# Patient Record
Sex: Male | Born: 1955
Health system: Southern US, Community
[De-identification: ages and names within clinical notes are randomized; demographics above are authoritative.]

## PROBLEM LIST (undated history)

## (undated) DIAGNOSIS — I499 Cardiac arrhythmia, unspecified: Secondary | ICD-10-CM

## (undated) DIAGNOSIS — I10 Essential (primary) hypertension: Secondary | ICD-10-CM

## (undated) DIAGNOSIS — T7840XA Allergy, unspecified, initial encounter: Secondary | ICD-10-CM

## (undated) DIAGNOSIS — R200 Anesthesia of skin: Secondary | ICD-10-CM

## (undated) DIAGNOSIS — M1990 Unspecified osteoarthritis, unspecified site: Secondary | ICD-10-CM

## (undated) HISTORY — DX: Allergy, unspecified, initial encounter: T78.40XA

## (undated) HISTORY — PX: TONSILLECTOMY: SUR1361

---

## 2002-02-28 ENCOUNTER — Emergency Department (HOSPITAL_COMMUNITY): Admission: EM | Admit: 2002-02-28 | Discharge: 2002-02-28 | Payer: Self-pay | Admitting: Emergency Medicine

## 2004-04-08 ENCOUNTER — Emergency Department (HOSPITAL_COMMUNITY): Admission: EM | Admit: 2004-04-08 | Discharge: 2004-04-08 | Payer: Self-pay | Admitting: Family Medicine

## 2005-03-10 ENCOUNTER — Emergency Department (HOSPITAL_COMMUNITY): Admission: EM | Admit: 2005-03-10 | Discharge: 2005-03-10 | Payer: Self-pay | Admitting: Family Medicine

## 2006-05-14 ENCOUNTER — Emergency Department (HOSPITAL_COMMUNITY): Admission: EM | Admit: 2006-05-14 | Discharge: 2006-05-14 | Payer: Self-pay | Admitting: Family Medicine

## 2006-08-05 ENCOUNTER — Emergency Department (HOSPITAL_COMMUNITY): Admission: EM | Admit: 2006-08-05 | Discharge: 2006-08-05 | Payer: Self-pay | Admitting: Family Medicine

## 2007-02-01 ENCOUNTER — Emergency Department (HOSPITAL_COMMUNITY): Admission: EM | Admit: 2007-02-01 | Discharge: 2007-02-01 | Payer: Self-pay | Admitting: Emergency Medicine

## 2007-03-16 ENCOUNTER — Emergency Department (HOSPITAL_COMMUNITY): Admission: EM | Admit: 2007-03-16 | Discharge: 2007-03-16 | Payer: Self-pay | Admitting: Emergency Medicine

## 2007-05-08 ENCOUNTER — Emergency Department (HOSPITAL_COMMUNITY): Admission: EM | Admit: 2007-05-08 | Discharge: 2007-05-08 | Payer: Self-pay | Admitting: Emergency Medicine

## 2007-08-24 ENCOUNTER — Encounter (INDEPENDENT_AMBULATORY_CARE_PROVIDER_SITE_OTHER): Payer: Self-pay | Admitting: *Deleted

## 2008-01-07 ENCOUNTER — Encounter: Admission: RE | Admit: 2008-01-07 | Discharge: 2008-01-07 | Payer: Self-pay | Admitting: Sports Medicine

## 2009-01-06 ENCOUNTER — Emergency Department (HOSPITAL_COMMUNITY): Admission: EM | Admit: 2009-01-06 | Discharge: 2009-01-06 | Payer: Self-pay | Admitting: Family Medicine

## 2009-05-11 ENCOUNTER — Encounter: Payer: Self-pay | Admitting: *Deleted

## 2009-06-19 ENCOUNTER — Encounter: Payer: Self-pay | Admitting: *Deleted

## 2009-08-13 ENCOUNTER — Emergency Department (HOSPITAL_COMMUNITY): Admission: EM | Admit: 2009-08-13 | Discharge: 2009-08-13 | Payer: Self-pay | Admitting: Emergency Medicine

## 2009-11-29 ENCOUNTER — Ambulatory Visit: Payer: Self-pay | Admitting: Internal Medicine

## 2010-01-29 ENCOUNTER — Encounter (INDEPENDENT_AMBULATORY_CARE_PROVIDER_SITE_OTHER): Payer: Self-pay | Admitting: Internal Medicine

## 2010-01-29 ENCOUNTER — Ambulatory Visit: Payer: Self-pay | Admitting: Internal Medicine

## 2010-01-29 LAB — CONVERTED CEMR LAB
BUN: 19 mg/dL (ref 6–23)
Calcium: 9.9 mg/dL (ref 8.4–10.5)
Chloride: 99 meq/L (ref 96–112)
Creatinine, Ser: 1 mg/dL (ref 0.40–1.50)
Glucose, Bld: 46 mg/dL — ABNORMAL LOW (ref 70–99)
Microalb, Ur: 0.6 mg/dL (ref 0.00–1.89)
Potassium: 4 meq/L (ref 3.5–5.3)

## 2010-05-07 NOTE — Miscellaneous (Signed)
Summary: Do Not Reschedule  Missed NP appt.  Per [facility] policy is not allowed to reschedule.  Dennison Nancy RN  May 11, 2009 10:05 AM

## 2010-05-07 NOTE — Miscellaneous (Signed)
Summary: See Bartholome Bill if patient calls  This patient was given a second chance after no showing his NP appt and no showed that one.  He IS NOT allowed to reschedule. Dennison Nancy RN  June 19, 2009 4:22 PM

## 2010-07-11 LAB — CULTURE, ROUTINE-ABSCESS

## 2010-07-13 ENCOUNTER — Inpatient Hospital Stay (INDEPENDENT_AMBULATORY_CARE_PROVIDER_SITE_OTHER)
Admission: RE | Admit: 2010-07-13 | Discharge: 2010-07-13 | Disposition: A | Discharge status: Home / Self Care | Payer: Self-pay | Source: Ambulatory Visit | Attending: Family Medicine | Admitting: Family Medicine

## 2010-07-13 DIAGNOSIS — S8010XA Contusion of unspecified lower leg, initial encounter: Secondary | ICD-10-CM

## 2012-07-02 ENCOUNTER — Encounter: Payer: Self-pay | Admitting: Family Medicine

## 2013-06-14 ENCOUNTER — Other Ambulatory Visit (HOSPITAL_COMMUNITY): Payer: Self-pay | Admitting: Orthopaedic Surgery

## 2013-08-22 NOTE — Patient Instructions (Signed)
Your procedure is scheduled on:08/26/13  FRIDAY    Report to [facility] at   1000    AM.  Call this number if you have problems the morning of surgery: [phone]        Do not eat food :After Midnight. Thursday NIGHT--- MAY HAVE CLEAR LIQUIDS Friday MORNING UNTIL 06:00 AM,  THEN NOTHING BY MOUTH   Take these medicines the morning of surgery with A SIP OF WATER:   .  Contacts, dentures or partial plates, or metal hairpins  can not be worn to surgery. Your family will be responsible for glasses, dentures, hearing aides while you are in surgery  Leave suitcase in the car. After surgery it may be brought to your room.  For patients admitted to the hospital, checkout time is 11:00 AM day of  discharge.                DO NOT WEAR JEWELRY, LOTIONS, POWDERS, OR PERFUMES.  WOMEN-- DO NOT SHAVE LEGS OR UNDERARMS FOR 48 HOURS BEFORE SHOWERS. MEN MAY SHAVE FACE.  Patients discharged the day of surgery will not be allowed to drive home. IF going home the day of surgery, you must have a driver and someone to stay with you for the first 24 hours  Name and phone number of your driver:                                                                                                                                         [facility] - Preparing for Surgery Before surgery, you can play an important role.  Because skin is not sterile, your skin needs to be as free of germs as possible.  You can reduce the number of germs on your skin by washing with CHG (chlorahexidine gluconate) soap before surgery.  CHG is an antiseptic cleaner which kills germs and bonds with the skin to continue killing germs even after washing. Please DO NOT use if you have an allergy to CHG or antibacterial soaps.  If your skin becomes reddened/irritated stop using the CHG and inform your nurse when you arrive at Short Stay. Do not shave (including legs and underarms) for at least 48 hours prior to the first CHG  shower.  You may shave your face. Please follow these instructions carefully:  1.  Shower with CHG Soap the night before surgery and the  morning of Surgery.  2.  If you choose to wash your hair, wash your hair first as usual with your  normal  shampoo.  3.  After you shampoo, rinse your hair and body thoroughly to remove the  shampoo.                           4.  Use CHG as you would any other liquid soap.  You can apply chg directly  to the skin and wash                       Gently with a scrungie or clean washcloth.  5.  Apply the CHG Soap to your body ONLY FROM THE NECK DOWN.   Do not use on open                           Wound or open sores. Avoid contact with eyes, ears mouth and genitals (private parts).                        Genitals (private parts) with your normal soap.             6.  Wash thoroughly, paying special attention to the area where your surgery  will be performed.  7.  Thoroughly rinse your body with warm water from the neck down.  8.  DO NOT shower/wash with your normal soap after using and rinsing off  the CHG Soap.                9.  Pat yourself dry with a clean towel.            10.  Wear clean pajamas.            11.  Place clean sheets on your bed the night of your first shower and do not  sleep with pets. Day of Surgery : Do not apply any lotions/deodorants the morning of surgery.  Please wear clean clothes to the hospital/surgery center.  FAILURE TO FOLLOW THESE INSTRUCTIONS MAY RESULT IN THE CANCELLATION OF YOUR SURGERY PATIENT SIGNATURE_________________________________  NURSE SIGNATURE__________________________________  ________________________________________________________________________    CLEAR LIQUID DIET   Foods Allowed                                                                     Foods Excluded  Coffee and tea, regular and decaf                             liquids that you cannot  Plain Jell-O in any flavor                                              see through such as: Fruit ices (not with fruit pulp)                                     milk, soups, orange juice  Iced Popsicles                                    All solid food Carbonated beverages, regular and diet  Cranberry, grape and apple juices Sports drinks like Gatorade Lightly seasoned clear broth or consume(fat free) Sugar, honey syrup  Sample Menu Breakfast                                Lunch                                     Supper Cranberry juice                    Beef broth                            Chicken broth Jell-O                                     Grape juice                           Apple juice Coffee or tea                        Jell-O                                      Popsicle                                                Coffee or tea                        Coffee or tea  _____________________________________________________________________   WHAT IS A BLOOD TRANSFUSION? Blood Transfusion Information  A transfusion is the replacement of blood or some of its parts. Blood is made up of multiple cells which provide different functions.  Red blood cells carry oxygen and are used for blood loss replacement.  White blood cells fight against infection.  Platelets control bleeding.  Plasma helps clot blood.  Other blood products are available for specialized needs, such as hemophilia or other clotting disorders. BEFORE THE TRANSFUSION  Who gives blood for transfusions?   Healthy volunteers who are fully evaluated to make sure their blood is safe. This is blood bank blood. Transfusion therapy is the safest it has ever been in the practice of medicine. Before blood is taken from a donor, a complete history is taken to make sure that person has no history of diseases nor engages in risky social behavior (examples are intravenous drug use or sexual activity with multiple partners). The donor's travel  history is screened to minimize risk of transmitting infections, such as malaria. The donated blood is tested for signs of infectious diseases, such as HIV and hepatitis. The blood is then tested to be sure it is compatible with you in order to minimize the chance of a transfusion reaction. If you or a relative donates blood, this is often done in anticipation of surgery and is not appropriate for emergency situations. It takes many days to process the donated blood.  RISKS AND COMPLICATIONS Although transfusion therapy is very safe and saves many lives, the main dangers of transfusion include:   Getting an infectious disease.  Developing a transfusion reaction. This is an allergic reaction to something in the blood you were given. Every precaution is taken to prevent this. The decision to have a blood transfusion has been considered carefully by your caregiver before blood is given. Blood is not given unless the benefits outweigh the risks. AFTER THE TRANSFUSION  Right after receiving a blood transfusion, you will usually feel much better and more energetic. This is especially true if your red blood cells have gotten low (anemic). The transfusion raises the level of the red blood cells which carry oxygen, and this usually causes an energy increase.  The nurse administering the transfusion will monitor you carefully for complications. HOME CARE INSTRUCTIONS  No special instructions are needed after a transfusion. You may find your energy is better. Speak with your caregiver about any limitations on activity for underlying diseases you may have. SEEK MEDICAL CARE IF:   Your condition is not improving after your transfusion.  You develop redness or irritation at the intravenous (IV) site. SEEK IMMEDIATE MEDICAL CARE IF:  Any of the following symptoms occur over the next 12 hours:  Shaking chills.  You have a temperature by mouth above 102 F (38.9 C), not controlled by medicine.  Chest,  back, or muscle pain.  People around you feel you are not acting correctly or are confused.  Shortness of breath or difficulty breathing.  Dizziness and fainting.  You get a rash or develop hives.  You have a decrease in urine output.  Your urine turns a dark color or changes to pink, red, or brown. Any of the following symptoms occur over the next 10 days:  You have a temperature by mouth above 102 F (38.9 C), not controlled by medicine.  Shortness of breath.  Weakness after normal activity.  The white part of the eye turns yellow (jaundice).  You have a decrease in the amount of urine or are urinating less often.  Your urine turns a dark color or changes to pink, red, or brown. Document Released: 03/21/2000 Document Revised: 06/16/2011 Document Reviewed: 11/08/2007 [facility] Patient Information 2014 [place].  _______________________________________________________________________

## 2013-08-23 ENCOUNTER — Inpatient Hospital Stay (HOSPITAL_COMMUNITY)
Admission: RE | Admit: 2013-08-23 | Discharge: 2013-08-23 | Disposition: A | Discharge status: Home / Self Care | Payer: Self-pay | Source: Ambulatory Visit

## 2013-08-24 ENCOUNTER — Encounter (HOSPITAL_COMMUNITY)
Admission: RE | Admit: 2013-08-24 | Discharge: 2013-08-24 | Disposition: A | Discharge status: Home / Self Care | Payer: Medicare HMO | Source: Ambulatory Visit | Attending: Orthopaedic Surgery | Admitting: Orthopaedic Surgery

## 2013-08-24 ENCOUNTER — Encounter (HOSPITAL_COMMUNITY): Payer: Self-pay

## 2013-08-24 ENCOUNTER — Ambulatory Visit (HOSPITAL_COMMUNITY)
Admission: RE | Admit: 2013-08-24 | Discharge: 2013-08-24 | Disposition: A | Discharge status: Home / Self Care | Payer: Medicare HMO | Source: Ambulatory Visit | Attending: Anesthesiology | Admitting: Anesthesiology

## 2013-08-24 ENCOUNTER — Encounter (HOSPITAL_COMMUNITY): Payer: Self-pay | Admitting: Pharmacy Technician

## 2013-08-24 DIAGNOSIS — Z01818 Encounter for other preprocedural examination: Secondary | ICD-10-CM | POA: Insufficient documentation

## 2013-08-24 DIAGNOSIS — Z01812 Encounter for preprocedural laboratory examination: Secondary | ICD-10-CM | POA: Insufficient documentation

## 2013-08-24 DIAGNOSIS — Z01810 Encounter for preprocedural cardiovascular examination: Secondary | ICD-10-CM | POA: Insufficient documentation

## 2013-08-24 HISTORY — DX: Unspecified osteoarthritis, unspecified site: M19.90

## 2013-08-24 HISTORY — DX: Essential (primary) hypertension: I10

## 2013-08-24 LAB — URINALYSIS, ROUTINE W REFLEX MICROSCOPIC
BILIRUBIN URINE: NEGATIVE
Glucose, UA: NEGATIVE mg/dL
Hgb urine dipstick: NEGATIVE
Ketones, ur: NEGATIVE mg/dL
Leukocytes, UA: NEGATIVE
Nitrite: NEGATIVE
PH: 6 (ref 5.0–8.0)
PROTEIN: NEGATIVE mg/dL
Specific Gravity, Urine: 1.021 (ref 1.005–1.030)
Urobilinogen, UA: 0.2 mg/dL (ref 0.0–1.0)

## 2013-08-24 LAB — BASIC METABOLIC PANEL
BUN: 18 mg/dL (ref 6–23)
CO2: 30 mEq/L (ref 19–32)
CREATININE: 1.05 mg/dL (ref 0.50–1.35)
Calcium: 9.8 mg/dL (ref 8.4–10.5)
Chloride: 100 mEq/L (ref 96–112)
GFR calc Af Amer: 89 mL/min — ABNORMAL LOW (ref >90)
GFR, EST NON AFRICAN AMERICAN: 77 mL/min — AB (ref >90)
GLUCOSE: 94 mg/dL (ref 70–99)
Potassium: 5.2 mEq/L (ref 3.7–5.3)
SODIUM: 139 meq/L (ref 137–147)

## 2013-08-24 LAB — CBC
HCT: 42.3 % (ref 39.0–52.0)
Hemoglobin: 14.7 g/dL (ref 13.0–17.0)
MCH: 30.9 pg (ref 26.0–34.0)
MCHC: 34.8 g/dL (ref 30.0–36.0)
MCV: 89.1 fL (ref 78.0–100.0)
PLATELETS: 220 10*3/uL (ref 150–400)
RBC: 4.75 MIL/uL (ref 4.22–5.81)
RDW: 12.9 % (ref 11.5–15.5)
WBC: 9.1 10*3/uL (ref 4.0–10.5)

## 2013-08-24 LAB — SURGICAL PCR SCREEN
MRSA, PCR: NEGATIVE
STAPHYLOCOCCUS AUREUS: NEGATIVE

## 2013-08-24 LAB — APTT: APTT: 33 s (ref 24–37)

## 2013-08-24 LAB — PROTIME-INR
INR: 1.05 (ref 0.00–1.49)
Prothrombin Time: 13.5 seconds (ref 11.6–15.2)

## 2013-08-24 NOTE — Patient Instructions (Addendum)
20 Victor Santiago  08/24/2013   Your procedure is scheduled on: 08/26/13  [facility].  Call this number if you have problems the morning of surgery [phone]   Remember:   Do not eat food or drink liquids After Midnight.   Do not wear jewelry, make-up or nail polish.  Do not wear lotions, powders, or perfumes. You may wear deodorant.  Do not shave 48 hours prior to surgery. Men may shave face and neck.  Do not bring valuables to the hospital.  Contacts, dentures or bridgework may not be worn into surgery.  Leave suitcase in the car. After surgery it may be brought to your room.  For patients admitted to the hospital, checkout time is 11:00 AM the day of discharge.   Please read over the following fact sheets that you were given: MRSA Information Birdie Sonsachel Rulon Abdalla, RN  pre op nurse call if needed [phone]    [facility] - Preparing for Surgery Before surgery, you can play an important role.  Because skin is not sterile, your skin needs to be as free of germs as possible.  You can reduce the number of germs on your skin by washing with CHG (chlorahexidine gluconate) soap before surgery.  CHG is an antiseptic cleaner which kills germs and bonds with the skin to continue killing germs even after washing. Please DO NOT use if you have an allergy to CHG or antibacterial soaps.  If your skin becomes reddened/irritated stop using the CHG and inform your nurse when you arrive at Short Stay. Do not shave (including legs and underarms) for at least 48 hours prior to the first CHG shower.  You may shave your face/neck. Please follow these instructions carefully:  1.  Shower with CHG Soap the night before surgery and the  morning of Surgery.  2.  If you choose to wash your hair, wash your hair first as usual with your  normal  shampoo.  3.  After you shampoo, rinse your hair and body thoroughly to remove the  shampoo.                            4.  Use  CHG as you would any other liquid soap.  You can apply chg directly  to the skin and wash                       Gently with a scrungie or clean washcloth.  5.  Apply the CHG Soap to your body ONLY FROM THE NECK DOWN.   Do not use on face/ open                           Wound or open sores. Avoid contact with eyes, ears mouth and genitals (private parts).                       Wash face,  Genitals (private parts) with your normal soap.             6.  Wash thoroughly, paying special attention to the area where your surgery  will be performed.  7.  Thoroughly rinse your body with warm water from the neck down.  8.  DO NOT shower/wash with your normal soap after using and rinsing off  the CHG Soap.  9.  Pat yourself dry with a clean towel.            10.  Wear clean pajamas.            11.  Place clean sheets on your bed the night of your first shower and do not  sleep with pets. Day of Surgery : Do not apply any lotions the morning of surgery.  Please wear clean clothes to the hospital/surgery center.  FAILURE TO FOLLOW THESE INSTRUCTIONS MAY RESULT IN THE CANCELLATION OF YOUR SURGERY PATIENT SIGNATURE_________________________________  NURSE SIGNATURE__________________________________  ________________________________________________________________________  WHAT IS A BLOOD TRANSFUSION? Blood Transfusion Information  A transfusion is the replacement of blood or some of its parts. Blood is made up of multiple cells which provide different functions.  Red blood cells carry oxygen and are used for blood loss replacement.  White blood cells fight against infection.  Platelets control bleeding.  Plasma helps clot blood.  Other blood products are available for specialized needs, such as hemophilia or other clotting disorders. BEFORE THE TRANSFUSION  Who gives blood for transfusions?   Healthy volunteers who are fully evaluated to make sure their blood is safe. This is blood  bank blood. Transfusion therapy is the safest it has ever been in the practice of medicine. Before blood is taken from a donor, a complete history is taken to make sure that person has no history of diseases nor engages in risky social behavior (examples are intravenous drug use or sexual activity with multiple partners). The donor's travel history is screened to minimize risk of transmitting infections, such as malaria. The donated blood is tested for signs of infectious diseases, such as HIV and hepatitis. The blood is then tested to be sure it is compatible with you in order to minimize the chance of a transfusion reaction. If you or a relative donates blood, this is often done in anticipation of surgery and is not appropriate for emergency situations. It takes many days to process the donated blood. RISKS AND COMPLICATIONS Although transfusion therapy is very safe and saves many lives, the main dangers of transfusion include:   Getting an infectious disease.  Developing a transfusion reaction. This is an allergic reaction to something in the blood you were given. Every precaution is taken to prevent this. The decision to have a blood transfusion has been considered carefully by your caregiver before blood is given. Blood is not given unless the benefits outweigh the risks. AFTER THE TRANSFUSION  Right after receiving a blood transfusion, you will usually feel much better and more energetic. This is especially true if your red blood cells have gotten low (anemic). The transfusion raises the level of the red blood cells which carry oxygen, and this usually causes an energy increase.  The nurse administering the transfusion will monitor you carefully for complications. HOME CARE INSTRUCTIONS  No special instructions are needed after a transfusion. You may find your energy is better. Speak with your caregiver about any limitations on activity for underlying diseases you may have. SEEK MEDICAL CARE  IF:   Your condition is not improving after your transfusion.  You develop redness or irritation at the intravenous (IV) site. SEEK IMMEDIATE MEDICAL CARE IF:  Any of the following symptoms occur over the next 12 hours:  Shaking chills.  You have a temperature by mouth above 102 F (38.9 C), not controlled by medicine.  Chest, back, or muscle pain.  People around you feel you are not acting correctly or  are confused.  Shortness of breath or difficulty breathing.  Dizziness and fainting.  You get a rash or develop hives.  You have a decrease in urine output.  Your urine turns a dark color or changes to pink, red, or brown. Any of the following symptoms occur over the next 10 days:  You have a temperature by mouth above 102 F (38.9 C), not controlled by medicine.  Shortness of breath.  Weakness after normal activity.  The white part of the eye turns yellow (jaundice).  You have a decrease in the amount of urine or are urinating less often.  Your urine turns a dark color or changes to pink, red, or brown. Document Released: 03/21/2000 Document Revised: 06/16/2011 Document Reviewed: 11/08/2007 [employer] Patient Information 2014 [place].  _______________________________________________________________________  Incentive Spirometer  An incentive spirometer is a tool that can help keep your lungs clear and active. This tool measures how well you are filling your lungs with each breath. Taking long deep breaths may help reverse or decrease the chance of developing breathing (pulmonary) problems (especially infection) following:  A long period of time when you are unable to move or be active. BEFORE THE PROCEDURE   If the spirometer includes an indicator to show your best effort, your nurse or respiratory therapist will set it to a desired goal.  If possible, sit up straight or lean slightly forward. Try not to slouch.  Hold the incentive spirometer in an  upright position. INSTRUCTIONS FOR USE  1. Sit on the edge of your bed if possible, or sit up as far as you can in bed or on a chair. 2. Hold the incentive spirometer in an upright position. 3. Breathe out normally. 4. Place the mouthpiece in your mouth and seal your lips tightly around it. 5. Breathe in slowly and as deeply as possible, raising the piston or the ball toward the top of the column. 6. Hold your breath for 3-5 seconds or for as long as possible. Allow the piston or ball to fall to the bottom of the column. 7. Remove the mouthpiece from your mouth and breathe out normally. 8. Rest for a few seconds and repeat Steps 1 through 7 at least 10 times every 1-2 hours when you are awake. Take your time and take a few normal breaths between deep breaths. 9. The spirometer may include an indicator to show your best effort. Use the indicator as a goal to work toward during each repetition. 10. After each set of 10 deep breaths, practice coughing to be sure your lungs are clear. If you have an incision (the cut made at the time of surgery), support your incision when coughing by placing a pillow or rolled up towels firmly against it. Once you are able to get out of bed, walk around indoors and cough well. You may stop using the incentive spirometer when instructed by your caregiver.  RISKS AND COMPLICATIONS  Take your time so you do not get dizzy or light-headed.  If you are in pain, you may need to take or ask for pain medication before doing incentive spirometry. It is harder to take a deep breath if you are having pain. AFTER USE  Rest and breathe slowly and easily.  It can be helpful to keep track of a log of your progress. Your caregiver can provide you with a simple table to help with this. If you are using the spirometer at home, follow these instructions: [place] IF:  You are having difficultly using the spirometer.  You have trouble using the spirometer as often as  instructed.  Your pain medication is not giving enough relief while using the spirometer.  You develop fever of 100.5 F (38.1 C) or higher. SEEK IMMEDIATE MEDICAL CARE IF:   You cough up bloody sputum that had not been present before.  You develop fever of 102 F (38.9 C) or greater.  You develop worsening pain at or near the incision site. MAKE SURE YOU:   Understand these instructions.  Will watch your condition.  Will get help right away if you are not doing well or get worse. Document Released: 08/04/2006 Document Revised: 06/16/2011 Document Reviewed: 10/05/2006 [facility] Patient Information 2014 [place], [place].   ________________________________________________________________________

## 2013-08-26 ENCOUNTER — Encounter (HOSPITAL_COMMUNITY): Admission: RE | Payer: Self-pay | Source: Ambulatory Visit

## 2013-08-26 SURGERY — ARTHROPLASTY, HIP, TOTAL, ANTERIOR APPROACH
Anesthesia: Choice | Site: Hip | Laterality: Left

## 2013-08-31 ENCOUNTER — Inpatient Hospital Stay (HOSPITAL_COMMUNITY): Admission: RE | Admit: 2013-08-31 | Payer: Medicare HMO | Source: Ambulatory Visit | Admitting: Orthopaedic Surgery

## 2013-10-26 ENCOUNTER — Other Ambulatory Visit (HOSPITAL_COMMUNITY): Payer: Self-pay | Admitting: Orthopaedic Surgery

## 2013-11-08 ENCOUNTER — Encounter (HOSPITAL_COMMUNITY): Payer: Self-pay | Admitting: Pharmacy Technician

## 2013-11-08 ENCOUNTER — Other Ambulatory Visit (HOSPITAL_COMMUNITY): Payer: Self-pay | Admitting: Orthopaedic Surgery

## 2013-11-08 NOTE — Patient Instructions (Addendum)
Your procedure is scheduled on:  11/18/13  FRIDAY  Report to [facility]-- MAIN ENTRANCE- FOLLOW SIGNS TO SHORT STAY CENTER Short Stay Center at    10:20   AM.   Call this number if you have problems the morning of surgery: [phone]        Do not eat food  Or drink :After Midnight. Thursday NIGHT   Take these medicines the morning of surgery with A SIP OF WATER:NO REGULAR MEDICATIONS--- MAY TAKE TRAMADOL IF NEEDED   .  Contacts, dentures or partial plates, or metal hairpins  can not be worn to surgery. Your family will be responsible for glasses, dentures, hearing aides while you are in surgery  Leave suitcase in the car. After surgery it may be brought to your room.  For patients admitted to the hospital, checkout time is 11:00 AM day of  discharge.         [facility] IS NOT RESPONSIBLE FOR ANY VALUABLES                                                                  [place] - Preparing for Surgery Before surgery, you can play an important role.  Because skin is not sterile, your skin needs to be as free of germs as possible.  You can reduce the number of germs on your skin by washing with CHG (chlorahexidine gluconate) soap before surgery.  CHG is an antiseptic cleaner which kills germs and bonds with the skin to continue killing germs even after washing. Please DO NOT use if you have an allergy to CHG or antibacterial soaps.  If your skin becomes reddened/irritated stop using the CHG and inform your nurse when you arrive at Short Stay. Do not shave (including legs and underarms) for at least 48 hours prior to the first CHG shower.  You may shave your face/neck. Please follow these instructions carefully:  1.  Shower with CHG Soap the night before surgery and the  morning of Surgery.  2.  If you choose to wash your hair, wash your hair first as usual with your  normal  shampoo.  3.  After you shampoo, rinse your hair and body thoroughly to remove the  shampoo.                            4.  Use CHG as you would any other liquid soap.  You can apply chg directly  to the skin and wash                       Gently with a scrungie or clean washcloth.  5.  Apply the CHG Soap to your body ONLY FROM THE NECK DOWN.   Do not use on face/ open                           Wound or open sores. Avoid contact with eyes, ears mouth and genitals (private parts).                       Wash face,  Genitals (private parts) with your  normal soap.             6.  Wash thoroughly, paying special attention to the area where your surgery  will be performed.  7.  Thoroughly rinse your body with warm water from the neck down.  8.  DO NOT shower/wash with your normal soap after using and rinsing off  the CHG Soap.                9.  Pat yourself dry with a clean towel.            10.  Wear clean pajamas.            11.  Place clean sheets on your bed the night of your first shower and do not  sleep with pets. Day of Surgery : Do not apply any lotions/deodorants the morning of surgery.  Please wear clean clothes to the hospital/surgery center.  FAILURE TO FOLLOW THESE INSTRUCTIONS MAY RESULT IN THE CANCELLATION OF YOUR SURGERY PATIENT SIGNATURE_________________________________  NURSE SIGNATURE__________________________________  ________________________________________________________________________  WHAT IS A BLOOD TRANSFUSION? Blood Transfusion Information  A transfusion is the replacement of blood or some of its parts. Blood is made up of multiple cells which provide different functions.  Red blood cells carry oxygen and are used for blood loss replacement.  White blood cells fight against infection.  Platelets control bleeding.  Plasma helps clot blood.  Other blood products are available for specialized needs, such as hemophilia or other clotting disorders. BEFORE THE TRANSFUSION  Who gives blood for transfusions?   Healthy volunteers who are fully evaluated to make  sure their blood is safe. This is blood bank blood. Transfusion therapy is the safest it has ever been in the practice of medicine. Before blood is taken from a donor, a complete history is taken to make sure that person has no history of diseases nor engages in risky social behavior (examples are intravenous drug use or sexual activity with multiple partners). The donor's travel history is screened to minimize risk of transmitting infections, such as malaria. The donated blood is tested for signs of infectious diseases, such as HIV and hepatitis. The blood is then tested to be sure it is compatible with you in order to minimize the chance of a transfusion reaction. If you or a relative donates blood, this is often done in anticipation of surgery and is not appropriate for emergency situations. It takes many days to process the donated blood. RISKS AND COMPLICATIONS Although transfusion therapy is very safe and saves many lives, the main dangers of transfusion include:   Getting an infectious disease.  Developing a transfusion reaction. This is an allergic reaction to something in the blood you were given. Every precaution is taken to prevent this. The decision to have a blood transfusion has been considered carefully by your caregiver before blood is given. Blood is not given unless the benefits outweigh the risks. AFTER THE TRANSFUSION  Right after receiving a blood transfusion, you will usually feel much better and more energetic. This is especially true if your red blood cells have gotten low (anemic). The transfusion raises the level of the red blood cells which carry oxygen, and this usually causes an energy increase.  The nurse administering the transfusion will monitor you carefully for complications. HOME CARE INSTRUCTIONS  No special instructions are needed after a transfusion. You may find your energy is better. Speak with your caregiver about any limitations on activity for underlying  diseases you may  have. SEEK MEDICAL CARE IF:   Your condition is not improving after your transfusion.  You develop redness or irritation at the intravenous (IV) site. SEEK IMMEDIATE MEDICAL CARE IF:  Any of the following symptoms occur over the next 12 hours:  Shaking chills.  You have a temperature by mouth above 102 F (38.9 C), not controlled by medicine.  Chest, back, or muscle pain.  People around you feel you are not acting correctly or are confused.  Shortness of breath or difficulty breathing.  Dizziness and fainting.  You get a rash or develop hives.  You have a decrease in urine output.  Your urine turns a dark color or changes to pink, red, or brown. Any of the following symptoms occur over the next 10 days:  You have a temperature by mouth above 102 F (38.9 C), not controlled by medicine.  Shortness of breath.  Weakness after normal activity.  The white part of the eye turns yellow (jaundice).  You have a decrease in the amount of urine or are urinating less often.  Your urine turns a dark color or changes to pink, red, or brown. Document Released: 03/21/2000 Document Revised: 06/16/2011 Document Reviewed: 11/08/2007 [facility] Patient Information 2014 [place], [place].  _______________________________________________________________________

## 2013-11-08 NOTE — Progress Notes (Signed)
Chest, ekg 5/15 epic

## 2013-11-09 ENCOUNTER — Encounter (INDEPENDENT_AMBULATORY_CARE_PROVIDER_SITE_OTHER): Payer: Self-pay

## 2013-11-09 ENCOUNTER — Encounter (HOSPITAL_COMMUNITY): Payer: Self-pay

## 2013-11-09 ENCOUNTER — Encounter (HOSPITAL_COMMUNITY)
Admission: RE | Admit: 2013-11-09 | Discharge: 2013-11-09 | Disposition: A | Discharge status: Home / Self Care | Payer: Medicare HMO | Source: Ambulatory Visit | Attending: Orthopaedic Surgery | Admitting: Orthopaedic Surgery

## 2013-11-09 DIAGNOSIS — Z01818 Encounter for other preprocedural examination: Secondary | ICD-10-CM | POA: Insufficient documentation

## 2013-11-09 DIAGNOSIS — Z01812 Encounter for preprocedural laboratory examination: Secondary | ICD-10-CM | POA: Diagnosis not present

## 2013-11-09 LAB — BASIC METABOLIC PANEL
ANION GAP: 8 (ref 5–15)
BUN: 14 mg/dL (ref 6–23)
CHLORIDE: 102 meq/L (ref 96–112)
CO2: 29 meq/L (ref 19–32)
Calcium: 9.6 mg/dL (ref 8.4–10.5)
Creatinine, Ser: 1.05 mg/dL (ref 0.50–1.35)
GFR calc Af Amer: 89 mL/min — ABNORMAL LOW (ref >90)
GFR calc non Af Amer: 77 mL/min — ABNORMAL LOW (ref >90)
GLUCOSE: 95 mg/dL (ref 70–99)
Potassium: 4.8 mEq/L (ref 3.7–5.3)
SODIUM: 139 meq/L (ref 137–147)

## 2013-11-09 LAB — URINALYSIS, ROUTINE W REFLEX MICROSCOPIC
Bilirubin Urine: NEGATIVE
GLUCOSE, UA: NEGATIVE mg/dL
Ketones, ur: NEGATIVE mg/dL
Leukocytes, UA: NEGATIVE
Nitrite: NEGATIVE
Protein, ur: NEGATIVE mg/dL
SPECIFIC GRAVITY, URINE: 1.023 (ref 1.005–1.030)
Urobilinogen, UA: 0.2 mg/dL (ref 0.0–1.0)
pH: 5.5 (ref 5.0–8.0)

## 2013-11-09 LAB — CBC
HEMATOCRIT: 42.2 % (ref 39.0–52.0)
HEMOGLOBIN: 14.5 g/dL (ref 13.0–17.0)
MCH: 31.2 pg (ref 26.0–34.0)
MCHC: 34.4 g/dL (ref 30.0–36.0)
MCV: 90.8 fL (ref 78.0–100.0)
Platelets: 243 10*3/uL (ref 150–400)
RBC: 4.65 MIL/uL (ref 4.22–5.81)
RDW: 12.9 % (ref 11.5–15.5)
WBC: 7.3 10*3/uL (ref 4.0–10.5)

## 2013-11-09 LAB — SURGICAL PCR SCREEN
MRSA, PCR: NEGATIVE
STAPHYLOCOCCUS AUREUS: POSITIVE — AB

## 2013-11-09 LAB — URINE MICROSCOPIC-ADD ON

## 2013-11-09 LAB — PROTIME-INR
INR: 1.05 (ref 0.00–1.49)
PROTHROMBIN TIME: 13.7 s (ref 11.6–15.2)

## 2013-11-09 LAB — APTT: aPTT: 30 seconds (ref 24–37)

## 2013-11-09 NOTE — Progress Notes (Signed)
U/a with micro faxed to Dr Magnus IvanBlackman [facility]

## 2013-11-18 ENCOUNTER — Inpatient Hospital Stay (HOSPITAL_COMMUNITY): Payer: Medicare HMO | Admitting: Anesthesiology

## 2013-11-18 ENCOUNTER — Encounter (HOSPITAL_COMMUNITY): Payer: Medicare HMO | Admitting: Anesthesiology

## 2013-11-18 ENCOUNTER — Encounter (HOSPITAL_COMMUNITY): Payer: Self-pay | Admitting: *Deleted

## 2013-11-18 ENCOUNTER — Inpatient Hospital Stay (HOSPITAL_COMMUNITY): Payer: Medicare HMO

## 2013-11-18 ENCOUNTER — Encounter (HOSPITAL_COMMUNITY)
Admission: RE | Disposition: A | Discharge status: Home Health | Payer: Self-pay | Source: Ambulatory Visit | Attending: Orthopaedic Surgery

## 2013-11-18 ENCOUNTER — Inpatient Hospital Stay (HOSPITAL_COMMUNITY)
Admission: RE | Admit: 2013-11-18 | Discharge: 2013-11-21 | DRG: 470 | Disposition: A | Discharge status: Home Health | Payer: Medicare HMO | Source: Ambulatory Visit | Attending: Orthopaedic Surgery | Admitting: Orthopaedic Surgery

## 2013-11-18 DIAGNOSIS — I10 Essential (primary) hypertension: Secondary | ICD-10-CM | POA: Diagnosis present

## 2013-11-18 DIAGNOSIS — M169 Osteoarthritis of hip, unspecified: Secondary | ICD-10-CM | POA: Diagnosis not present

## 2013-11-18 DIAGNOSIS — IMO0002 Reserved for concepts with insufficient information to code with codable children: Secondary | ICD-10-CM

## 2013-11-18 DIAGNOSIS — M1612 Unilateral primary osteoarthritis, left hip: Secondary | ICD-10-CM

## 2013-11-18 DIAGNOSIS — Z96642 Presence of left artificial hip joint: Secondary | ICD-10-CM

## 2013-11-18 DIAGNOSIS — M1610 Unilateral primary osteoarthritis, unspecified hip: Principal | ICD-10-CM | POA: Diagnosis present

## 2013-11-18 DIAGNOSIS — Z01812 Encounter for preprocedural laboratory examination: Secondary | ICD-10-CM | POA: Diagnosis not present

## 2013-11-18 DIAGNOSIS — M25559 Pain in unspecified hip: Secondary | ICD-10-CM | POA: Diagnosis present

## 2013-11-18 HISTORY — PX: TOTAL HIP ARTHROPLASTY: SHX124

## 2013-11-18 LAB — ABO/RH: ABO/RH(D): O NEG

## 2013-11-18 LAB — TYPE AND SCREEN
ABO/RH(D): O NEG
Antibody Screen: NEGATIVE

## 2013-11-18 SURGERY — ARTHROPLASTY, HIP, TOTAL, ANTERIOR APPROACH
Anesthesia: General | Site: Hip | Laterality: Left

## 2013-11-18 MED ORDER — HYDROMORPHONE HCL PF 1 MG/ML IJ SOLN
1.0000 mg | INTRAMUSCULAR | Status: DC | PRN
  Administered 2013-11-18 (×2): 1 mg via INTRAVENOUS
  Filled 2013-11-18 (×2): qty 1

## 2013-11-18 MED ORDER — LACTATED RINGERS IV SOLN
INTRAVENOUS | Status: DC
  Administered 2013-11-18: 1000 mL via INTRAVENOUS
  Administered 2013-11-18: 12:00:00 via INTRAVENOUS

## 2013-11-18 MED ORDER — ONDANSETRON HCL 4 MG/2ML IJ SOLN
INTRAMUSCULAR | Status: DC | PRN
  Administered 2013-11-18: 4 mg via INTRAVENOUS

## 2013-11-18 MED ORDER — HYDROMORPHONE HCL PF 1 MG/ML IJ SOLN
0.2500 mg | INTRAMUSCULAR | Status: DC | PRN
  Administered 2013-11-18 (×2): 0.25 mg via INTRAVENOUS
  Administered 2013-11-18: 0.5 mg via INTRAVENOUS
  Administered 2013-11-18 (×2): 0.25 mg via INTRAVENOUS
  Administered 2013-11-18: 0.5 mg via INTRAVENOUS

## 2013-11-18 MED ORDER — MIDAZOLAM HCL 5 MG/5ML IJ SOLN
INTRAMUSCULAR | Status: DC | PRN
  Administered 2013-11-18: 2 mg via INTRAVENOUS

## 2013-11-18 MED ORDER — ROCURONIUM BROMIDE 100 MG/10ML IV SOLN
INTRAVENOUS | Status: DC | PRN
  Administered 2013-11-18: 50 mg via INTRAVENOUS

## 2013-11-18 MED ORDER — SODIUM CHLORIDE 0.9 % IR SOLN
Status: DC | PRN
  Administered 2013-11-18: 1000 mL

## 2013-11-18 MED ORDER — PROPOFOL 10 MG/ML IV BOLUS
INTRAVENOUS | Status: AC
  Filled 2013-11-18: qty 20

## 2013-11-18 MED ORDER — ONDANSETRON HCL 4 MG PO TABS: 4.0000 mg | ORAL_TABLET | Freq: Four times a day (QID) | ORAL | Status: DC | PRN

## 2013-11-18 MED ORDER — ALUM & MAG HYDROXIDE-SIMETH 200-200-20 MG/5ML PO SUSP: 30.0000 mL | ORAL | Status: DC | PRN

## 2013-11-18 MED ORDER — CEFAZOLIN SODIUM-DEXTROSE 2-3 GM-% IV SOLR
2.0000 g | INTRAVENOUS | Status: AC
  Administered 2013-11-18: 2 g via INTRAVENOUS

## 2013-11-18 MED ORDER — SODIUM CHLORIDE 0.9 % IV SOLN
INTRAVENOUS | Status: DC
  Administered 2013-11-18: 16:00:00 via INTRAVENOUS

## 2013-11-18 MED ORDER — HYDROMORPHONE HCL PF 1 MG/ML IJ SOLN
0.5000 mg | INTRAMUSCULAR | Status: DC | PRN
  Administered 2013-11-18: 0.25 mg via INTRAVENOUS

## 2013-11-18 MED ORDER — ACETAMINOPHEN 325 MG PO TABS
650.0000 mg | ORAL_TABLET | Freq: Four times a day (QID) | ORAL | Status: DC | PRN
  Administered 2013-11-19 – 2013-11-21 (×4): 650 mg via ORAL
  Filled 2013-11-18 (×4): qty 2

## 2013-11-18 MED ORDER — GLYCOPYRROLATE 0.2 MG/ML IJ SOLN
INTRAMUSCULAR | Status: AC
  Filled 2013-11-18: qty 3

## 2013-11-18 MED ORDER — FENTANYL CITRATE 0.05 MG/ML IJ SOLN
INTRAMUSCULAR | Status: DC | PRN
  Administered 2013-11-18: 50 ug via INTRAVENOUS
  Administered 2013-11-18 (×2): 100 ug via INTRAVENOUS

## 2013-11-18 MED ORDER — METOCLOPRAMIDE HCL 5 MG/ML IJ SOLN: 5.0000 mg | Freq: Three times a day (TID) | INTRAMUSCULAR | Status: DC | PRN

## 2013-11-18 MED ORDER — ADULT MULTIVITAMIN W/MINERALS CH
1.0000 | ORAL_TABLET | Freq: Every day | ORAL | Status: DC
  Administered 2013-11-19 – 2013-11-21 (×3): 1 via ORAL
  Filled 2013-11-18 (×3): qty 1

## 2013-11-18 MED ORDER — ONDANSETRON HCL 4 MG/2ML IJ SOLN: 4.0000 mg | Freq: Four times a day (QID) | INTRAMUSCULAR | Status: DC | PRN

## 2013-11-18 MED ORDER — OXYCODONE HCL 5 MG PO TABS
5.0000 mg | ORAL_TABLET | ORAL | Status: DC | PRN
  Administered 2013-11-18: 5 mg via ORAL
  Administered 2013-11-19 – 2013-11-21 (×9): 10 mg via ORAL
  Administered 2013-11-21: 5 mg via ORAL
  Administered 2013-11-21: 10 mg via ORAL
  Filled 2013-11-18 (×2): qty 2
  Filled 2013-11-18: qty 1
  Filled 2013-11-18 (×9): qty 2

## 2013-11-18 MED ORDER — METHOCARBAMOL 1000 MG/10ML IJ SOLN
500.0000 mg | Freq: Four times a day (QID) | INTRAVENOUS | Status: DC | PRN
  Administered 2013-11-18: 500 mg via INTRAVENOUS
  Filled 2013-11-18: qty 5

## 2013-11-18 MED ORDER — DEXAMETHASONE SODIUM PHOSPHATE 10 MG/ML IJ SOLN
INTRAMUSCULAR | Status: AC
  Filled 2013-11-18: qty 1

## 2013-11-18 MED ORDER — AMLODIPINE BESY-BENAZEPRIL HCL 5-20 MG PO CAPS: 1.0000 | ORAL_CAPSULE | Freq: Every morning | ORAL | Status: DC

## 2013-11-18 MED ORDER — GLYCOPYRROLATE 0.2 MG/ML IJ SOLN
INTRAMUSCULAR | Status: DC | PRN
  Administered 2013-11-18: 0.6 mg via INTRAVENOUS

## 2013-11-18 MED ORDER — DOCUSATE SODIUM 100 MG PO CAPS
100.0000 mg | ORAL_CAPSULE | Freq: Two times a day (BID) | ORAL | Status: DC
  Administered 2013-11-19 – 2013-11-21 (×5): 100 mg via ORAL

## 2013-11-18 MED ORDER — HYDROMORPHONE HCL PF 1 MG/ML IJ SOLN
INTRAMUSCULAR | Status: AC
  Filled 2013-11-18: qty 1

## 2013-11-18 MED ORDER — HYDROMORPHONE HCL PF 1 MG/ML IJ SOLN
INTRAMUSCULAR | Status: DC | PRN
  Administered 2013-11-18 (×4): 0.5 mg via INTRAVENOUS

## 2013-11-18 MED ORDER — LIDOCAINE HCL (PF) 2 % IJ SOLN
INTRAMUSCULAR | Status: DC | PRN
  Administered 2013-11-18: 75 mg via INTRADERMAL

## 2013-11-18 MED ORDER — LACTATED RINGERS IV SOLN: INTRAVENOUS | Status: DC

## 2013-11-18 MED ORDER — KETOROLAC TROMETHAMINE 30 MG/ML IJ SOLN
15.0000 mg | Freq: Once | INTRAMUSCULAR | Status: AC | PRN
  Administered 2013-11-18: 30 mg via INTRAVENOUS

## 2013-11-18 MED ORDER — METOCLOPRAMIDE HCL 10 MG PO TABS
5.0000 mg | ORAL_TABLET | Freq: Three times a day (TID) | ORAL | Status: DC | PRN
Start: 2013-11-18 — End: 2013-11-21

## 2013-11-18 MED ORDER — 0.9 % SODIUM CHLORIDE (POUR BTL) OPTIME
TOPICAL | Status: DC | PRN
  Administered 2013-11-18: 1000 mL

## 2013-11-18 MED ORDER — ONDANSETRON HCL 4 MG/2ML IJ SOLN
INTRAMUSCULAR | Status: AC
  Filled 2013-11-18: qty 2

## 2013-11-18 MED ORDER — AMLODIPINE BESYLATE 5 MG PO TABS
5.0000 mg | ORAL_TABLET | Freq: Every day | ORAL | Status: DC
  Administered 2013-11-18: 5 mg via ORAL
  Filled 2013-11-18: qty 1

## 2013-11-18 MED ORDER — CEFAZOLIN SODIUM 1-5 GM-% IV SOLN
1.0000 g | Freq: Four times a day (QID) | INTRAVENOUS | Status: AC
  Administered 2013-11-18 – 2013-11-19 (×2): 1 g via INTRAVENOUS
  Filled 2013-11-18 (×2): qty 50

## 2013-11-18 MED ORDER — MIDAZOLAM HCL 2 MG/2ML IJ SOLN
INTRAMUSCULAR | Status: AC
  Filled 2013-11-18: qty 2

## 2013-11-18 MED ORDER — DIPHENHYDRAMINE HCL 12.5 MG/5ML PO ELIX: 12.5000 mg | ORAL_SOLUTION | ORAL | Status: DC | PRN

## 2013-11-18 MED ORDER — MENTHOL 3 MG MT LOZG: 1.0000 | LOZENGE | OROMUCOSAL | Status: DC | PRN

## 2013-11-18 MED ORDER — ACETAMINOPHEN 650 MG RE SUPP: 650.0000 mg | Freq: Four times a day (QID) | RECTAL | Status: DC | PRN

## 2013-11-18 MED ORDER — PROPOFOL 10 MG/ML IV BOLUS
INTRAVENOUS | Status: DC | PRN
  Administered 2013-11-18: 150 mg via INTRAVENOUS

## 2013-11-18 MED ORDER — FENTANYL CITRATE 0.05 MG/ML IJ SOLN
INTRAMUSCULAR | Status: AC
  Filled 2013-11-18: qty 2

## 2013-11-18 MED ORDER — POLYETHYLENE GLYCOL 3350 17 G PO PACK
17.0000 g | PACK | Freq: Every day | ORAL | Status: DC | PRN
  Administered 2013-11-19 – 2013-11-21 (×3): 17 g via ORAL

## 2013-11-18 MED ORDER — BENAZEPRIL HCL 20 MG PO TABS
20.0000 mg | ORAL_TABLET | Freq: Every day | ORAL | Status: DC
  Administered 2013-11-18 – 2013-11-21 (×4): 20 mg via ORAL
  Filled 2013-11-18 (×4): qty 1

## 2013-11-18 MED ORDER — KETOROLAC TROMETHAMINE 30 MG/ML IJ SOLN
INTRAMUSCULAR | Status: AC
  Filled 2013-11-18: qty 1

## 2013-11-18 MED ORDER — TRANEXAMIC ACID 100 MG/ML IV SOLN
1000.0000 mg | INTRAVENOUS | Status: AC
  Administered 2013-11-18: 1000 mg via INTRAVENOUS
  Filled 2013-11-18: qty 10

## 2013-11-18 MED ORDER — PHENOL 1.4 % MT LIQD: 1.0000 | OROMUCOSAL | Status: DC | PRN

## 2013-11-18 MED ORDER — NEOSTIGMINE METHYLSULFATE 10 MG/10ML IV SOLN
INTRAVENOUS | Status: DC | PRN
  Administered 2013-11-18: 4 mg via INTRAVENOUS

## 2013-11-18 MED ORDER — ZOLPIDEM TARTRATE 5 MG PO TABS
5.0000 mg | ORAL_TABLET | Freq: Every evening | ORAL | Status: DC | PRN
Start: 2013-11-18 — End: 2013-11-21

## 2013-11-18 MED ORDER — DEXAMETHASONE SODIUM PHOSPHATE 10 MG/ML IJ SOLN
INTRAMUSCULAR | Status: DC | PRN
  Administered 2013-11-18: 10 mg via INTRAVENOUS

## 2013-11-18 MED ORDER — NEOSTIGMINE METHYLSULFATE 10 MG/10ML IV SOLN
INTRAVENOUS | Status: AC
  Filled 2013-11-18: qty 1

## 2013-11-18 MED ORDER — AMLODIPINE BESYLATE 5 MG PO TABS
5.0000 mg | ORAL_TABLET | Freq: Every day | ORAL | Status: DC
  Administered 2013-11-19 – 2013-11-21 (×3): 5 mg via ORAL
  Filled 2013-11-18 (×4): qty 1

## 2013-11-18 MED ORDER — FENTANYL CITRATE 0.05 MG/ML IJ SOLN
INTRAMUSCULAR | Status: AC
  Filled 2013-11-18: qty 5

## 2013-11-18 MED ORDER — ASPIRIN EC 325 MG PO TBEC
325.0000 mg | DELAYED_RELEASE_TABLET | Freq: Two times a day (BID) | ORAL | Status: DC
  Administered 2013-11-18 – 2013-11-21 (×6): 325 mg via ORAL
  Filled 2013-11-18 (×8): qty 1

## 2013-11-18 MED ORDER — LIDOCAINE HCL (CARDIAC) 20 MG/ML IV SOLN
INTRAVENOUS | Status: AC
  Filled 2013-11-18: qty 5

## 2013-11-18 MED ORDER — HYDROMORPHONE HCL PF 2 MG/ML IJ SOLN
INTRAMUSCULAR | Status: AC
  Filled 2013-11-18: qty 1

## 2013-11-18 MED ORDER — METHOCARBAMOL 500 MG PO TABS
500.0000 mg | ORAL_TABLET | Freq: Four times a day (QID) | ORAL | Status: DC | PRN
  Administered 2013-11-18 – 2013-11-21 (×7): 500 mg via ORAL
  Filled 2013-11-18 (×7): qty 1

## 2013-11-18 MED ORDER — CEFAZOLIN SODIUM-DEXTROSE 2-3 GM-% IV SOLR
INTRAVENOUS | Status: AC
  Filled 2013-11-18: qty 50

## 2013-11-18 SURGICAL SUPPLY — 44 items
APL SKNCLS STERI-STRIP NONHPOA (GAUZE/BANDAGES/DRESSINGS) ×1
BENZOIN TINCTURE PRP APPL 2/3 (GAUZE/BANDAGES/DRESSINGS) ×2 IMPLANT
BLADE SAW SGTL 18X1.27X75 (BLADE) ×2 IMPLANT
BLADE SAW SGTL 18X1.27X75MM (BLADE) ×1
CAPT HIP PF COP ×2 IMPLANT
CELLS DAT CNTRL 66122 CELL SVR (MISCELLANEOUS) ×1 IMPLANT
CLOSURE WOUND 1/2 X4 (GAUZE/BANDAGES/DRESSINGS) ×1
COVER PERINEAL POST (MISCELLANEOUS) ×3 IMPLANT
DRAPE C-ARM 42X120 X-RAY (DRAPES) ×3 IMPLANT
DRAPE STERI IOBAN 125X83 (DRAPES) ×3 IMPLANT
DRAPE U-SHAPE 47X51 STRL (DRAPES) ×9 IMPLANT
DRSG AQUACEL AG ADV 3.5X10 (GAUZE/BANDAGES/DRESSINGS) ×3 IMPLANT
DURAPREP 26ML APPLICATOR (WOUND CARE) ×3 IMPLANT
ELECT BLADE TIP CTD 4 INCH (ELECTRODE) ×3 IMPLANT
ELECT REM PT RETURN 9FT ADLT (ELECTROSURGICAL) ×3
ELECTRODE REM PT RTRN 9FT ADLT (ELECTROSURGICAL) ×1 IMPLANT
FACESHIELD WRAPAROUND (MASK) ×12 IMPLANT
FACESHIELD WRAPAROUND OR TEAM (MASK) ×4 IMPLANT
GLOVE BIO SURGEON STRL SZ7.5 (GLOVE) ×3 IMPLANT
GLOVE BIOGEL PI IND STRL 7.0 (GLOVE) IMPLANT
GLOVE BIOGEL PI IND STRL 8 (GLOVE) ×2 IMPLANT
GLOVE BIOGEL PI INDICATOR 7.0 (GLOVE) ×2
GLOVE BIOGEL PI INDICATOR 8 (GLOVE) ×6
GLOVE ECLIPSE 7.5 STRL STRAW (GLOVE) ×2 IMPLANT
GLOVE ECLIPSE 8.0 STRL XLNG CF (GLOVE) ×3 IMPLANT
GLOVE SURG SS PI 7.0 STRL IVOR (GLOVE) ×2 IMPLANT
GLOVE SURG SS PI 8.0 STRL IVOR (GLOVE) ×2 IMPLANT
GOWN STRL REUS TWL 2XL XL LVL4 (GOWN DISPOSABLE) ×2 IMPLANT
GOWN STRL REUS W/TWL XL LVL3 (GOWN DISPOSABLE) ×8 IMPLANT
HANDPIECE INTERPULSE COAX TIP (DISPOSABLE) ×3
KIT BASIN OR (CUSTOM PROCEDURE TRAY) ×3 IMPLANT
PACK TOTAL JOINT (CUSTOM PROCEDURE TRAY) ×3 IMPLANT
RETRACTOR WND ALEXIS 18 MED (MISCELLANEOUS) ×1 IMPLANT
RTRCTR WOUND ALEXIS 18CM MED (MISCELLANEOUS) ×3
SET HNDPC FAN SPRY TIP SCT (DISPOSABLE) ×1 IMPLANT
STRIP CLOSURE SKIN 1/2X4 (GAUZE/BANDAGES/DRESSINGS) ×1 IMPLANT
SUT ETHIBOND NAB CT1 #1 30IN (SUTURE) ×3 IMPLANT
SUT MNCRL AB 4-0 PS2 18 (SUTURE) ×2 IMPLANT
SUT VIC AB 0 CT1 36 (SUTURE) ×3 IMPLANT
SUT VIC AB 1 CT1 36 (SUTURE) ×3 IMPLANT
SUT VIC AB 2-0 CT1 27 (SUTURE) ×6
SUT VIC AB 2-0 CT1 TAPERPNT 27 (SUTURE) ×2 IMPLANT
TOWEL OR 17X26 10 PK STRL BLUE (TOWEL DISPOSABLE) ×3 IMPLANT
TOWEL OR NON WOVEN STRL DISP B (DISPOSABLE) ×3 IMPLANT

## 2013-11-18 NOTE — H&P (Signed)
TOTAL HIP ADMISSION H&P  Patient is admitted for left total hip arthroplasty.  Subjective:  Chief Complaint: left hip pain  HPI: Victor Santiago, 58 y.o. male, has a history of pain and functional disability in the left hip(s) due to arthritis and patient has failed non-surgical conservative treatments for greater than 12 weeks to include NSAID's and/or analgesics, corticosteriod injections, flexibility and strengthening excercises and activity modification.  Onset of symptoms was gradual starting 5 years ago with gradually worsening course since that time.The patient noted no past surgery on the left hip(s).  Patient currently rates pain in the left hip at 10 out of 10 with activity. Patient has night pain, worsening of pain with activity and weight bearing, trendelenberg gait, pain that interfers with activities of daily living, pain with passive range of motion and crepitus. Patient has evidence of subchondral cysts, subchondral sclerosis, periarticular osteophytes and joint space narrowing by imaging studies. This condition presents safety issues increasing the risk of falls.  There is no current active infection.  Patient Active Problem List   Diagnosis Date Noted  . Arthritis of left hip 11/18/2013   Past Medical History  Diagnosis Date  . Hypertension   . Arthritis     Past Surgical History  Procedure Laterality Date  . Tonsillectomy      as child    No prescriptions prior to admission   No Known Allergies  History  Substance Use Topics  . Smoking status: Never Smoker   . Smokeless tobacco: Never Used  . Alcohol Use: Yes    No family history on file.   Review of Systems  Musculoskeletal: Positive for joint pain.  All other systems reviewed and are negative.   Objective:  Physical Exam  Constitutional: He is oriented to person, place, and time. He appears well-developed and well-nourished.  HENT:  Head: Normocephalic and atraumatic.  Eyes: EOM are normal. Pupils are  equal, round, and reactive to light.  Neck: Normal range of motion. Neck supple.  Cardiovascular: Normal rate and regular rhythm.   Respiratory: Effort normal and breath sounds normal.  GI: Soft. Bowel sounds are normal.  Musculoskeletal:       Left hip: He exhibits decreased range of motion, decreased strength, bony tenderness and crepitus.  Neurological: He is alert and oriented to person, place, and time.  Skin: Skin is warm and dry.  Psychiatric: He has a normal mood and affect.    Vital signs in last 24 hours:    Labs:   Estimated body mass index is 24.19 kg/(m^2) as calculated from the following:   Height as of 08/24/13: 5\' 4"  (1.626 m).   Weight as of 08/24/13: 63.957 kg (141 lb).   Imaging Review Plain radiographs demonstrate severe degenerative joint disease of the left hip(s). The bone quality appears to be good for age and reported activity level.  Assessment/Plan:  End stage arthritis, left hip(s)  The patient history, physical examination, clinical judgement of the provider and imaging studies are consistent with end stage degenerative joint disease of the left hip(s) and total hip arthroplasty is deemed medically necessary. The treatment options including medical management, injection therapy, arthroscopy and arthroplasty were discussed at length. The risks and benefits of total hip arthroplasty were presented and reviewed. The risks due to aseptic loosening, infection, stiffness, dislocation/subluxation,  thromboembolic complications and other imponderables were discussed.  The patient acknowledged the explanation, agreed to proceed with the plan and consent was signed. Patient is being admitted for inpatient treatment  for surgery, pain control, PT, OT, prophylactic antibiotics, VTE prophylaxis, progressive ambulation and ADL's and discharge planning.The patient is planning to be discharged home with home health services 

## 2013-11-18 NOTE — Anesthesia Preprocedure Evaluation (Addendum)
Anesthesia Evaluation  Patient identified by MRN, date of birth, ID band Patient awake    Reviewed: Allergy & Precautions, H&P , NPO status , Patient's Chart, lab work & pertinent test results  Airway Mallampati: II TM Distance: >3 FB Neck ROM: full    Dental  (+) Missing, Dental Advisory Given Missing half of front teeth:   Pulmonary neg pulmonary ROS,  breath sounds clear to auscultation  Pulmonary exam normal       Cardiovascular Exercise Tolerance: Good hypertension, Pt. on medications Rhythm:regular Rate:Normal  LAFB   Neuro/Psych negative neurological ROS  negative psych ROS   GI/Hepatic negative GI ROS, Neg liver ROS,   Endo/Other  negative endocrine ROS  Renal/GU negative Renal ROS  negative genitourinary   Musculoskeletal   Abdominal   Peds  Hematology negative hematology ROS (+)   Anesthesia Other Findings   Reproductive/Obstetrics negative OB ROS                          Anesthesia Physical Anesthesia Plan  ASA: II  Anesthesia Plan: General   Post-op Pain Management:    Induction: Intravenous  Airway Management Planned: Oral ETT  Additional Equipment:   Intra-op Plan:   Post-operative Plan: Extubation in OR  Informed Consent: I have reviewed the patients History and Physical, chart, labs and discussed the procedure including the risks, benefits and alternatives for the proposed anesthesia with the patient or authorized representative who has indicated his/her understanding and acceptance.   Dental Advisory Given  Plan Discussed with: CRNA and Surgeon  Anesthesia Plan Comments:        Anesthesia Quick Evaluation

## 2013-11-18 NOTE — Anesthesia Postprocedure Evaluation (Signed)
  Anesthesia Post-op Note  Patient: Victor Santiago  Procedure(s) Performed: Procedure(s) (LRB): LEFT TOTAL HIP ARTHROPLASTY ANTERIOR APPROACH (Left)  Patient Location: PACU  Anesthesia Type: General  Level of Consciousness: awake and alert   Airway and Oxygen Therapy: Patient Spontanous Breathing  Post-op Pain: mild  Post-op Assessment: Post-op Vital signs reviewed, Patient's Cardiovascular Status Stable, Respiratory Function Stable, Patent Airway and No signs of Nausea or vomiting  Last Vitals:  Filed Vitals:   11/18/13 0937  BP: 145/88  Pulse: 65  Temp: 36.4 C  Resp: 18    Post-op Vital Signs: stable   Complications: No apparent anesthesia complications

## 2013-11-18 NOTE — Progress Notes (Signed)
CARE MANAGEMENT NOTE 11/18/2013  Patient:  Victor Santiago,Victor Santiago   Account Number:  [id number]  Date Initiated:  11/18/2013  Documentation initiated by:  Alice Burnside  Subjective/Objective Assessment:   left hip replacement ant approach     Action/Plan:   home with dme and hhc   Anticipated DC Date:  11/21/2013   Anticipated DC Plan:  HOME W HOME HEALTH SERVICES  In-house referral  NA      DC Planning Services  CM consult      [facility]  NA   Choice offered to / List presented to:  C-1 Patient        HH arranged  HH-2 PT      [facility]   Status of service:  In process, will continue to follow Medicare Important Message given?  NA - LOS <3 / Initial given by admissions (If response is "NO", the following Medicare IM given date fields will be blank) Date Medicare IM given:   Medicare IM given by:   Date Additional Medicare IM given:   Additional Medicare IM given by:    Discharge Disposition:    Per UR Regulation:  Reviewed for med. necessity/level of care/duration of stay  If discussed at Long Length of Stay Meetings, dates discussed:    Comments:  Bjorn LoserRhonda Larayne Baxley,RN,BSN,CCM

## 2013-11-18 NOTE — Transfer of Care (Signed)
Immediate Anesthesia Transfer of Care Note  Patient: Victor Santiago  Procedure(s) Performed: Procedure(s): LEFT TOTAL HIP ARTHROPLASTY ANTERIOR APPROACH (Left)  Patient Location: PACU  Anesthesia Type:General  Level of Consciousness: awake, sedated, patient cooperative and responds to stimulation  Airway & Oxygen Therapy: Patient Spontanous Breathing and Patient connected to face mask oxygen  Post-op Assessment: Report given to PACU RN and Post -op Vital signs reviewed and stable  Post vital signs: Reviewed and stable  Complications: No apparent anesthesia complications

## 2013-11-18 NOTE — Brief Op Note (Signed)
11/18/2013  12:24 PM  PATIENT:  Victor Santiago  58 y.o. male  PRE-OPERATIVE DIAGNOSIS:  Severe osteoarthritis left hip  POST-OPERATIVE DIAGNOSIS:  Severe osteoarthritis left hip  PROCEDURE:  Procedure(s): LEFT TOTAL HIP ARTHROPLASTY ANTERIOR APPROACH (Left)  SURGEON:  Surgeon(s) and Role:    * Kathryne Hitchhristopher Y Blackman, MD - Primary  PHYSICIAN ASSISTANT: Rexene EdisonGil Clark, PA-C  ANESTHESIA:   general  EBL:  Total I/O In: 1000 [I.V.:1000] Out: 500 [Blood:500]  BLOOD ADMINISTERED:none  DRAINS: none   LOCAL MEDICATIONS USED:  NONE  SPECIMEN:  No Specimen  DISPOSITION OF SPECIMEN:  N/A  COUNTS:  YES  TOURNIQUET:  * No tourniquets in log *  DICTATION: .Other Dictation: Dictation Number [id number]  PLAN OF CARE: Admit to inpatient   PATIENT DISPOSITION:  PACU - hemodynamically stable.   Delay start of Pharmacological VTE agent (>24hrs) due to surgical blood loss or risk of bleeding: no

## 2013-11-18 NOTE — Evaluation (Signed)
Physical Therapy Evaluation Patient Details Name: DAQUAWN SEELMAN MRN: [id number] DOB: 1956-01-05 Today's Date: 11/18/2013   History of Present Illness  [name]  Clinical Impression  Pt tolerated well  With ambulation. Pt will benefit from PT while in acute care.    Follow Up Recommendations Home health PT;Supervision/Assistance - 24 hour    Equipment Recommendations   (TBD- has a 4 wheeled and SW.)    Recommendations for Other Services       Precautions / Restrictions Precautions Precautions: Fall      Mobility  Bed Mobility Overal bed mobility: Needs Assistance Bed Mobility: Supine to Sit     Supine to sit: Mod assist     General bed mobility comments: moved both legs together. needed support to get trunk upright.  Transfers Overall transfer level: Needs assistance Equipment used: Rolling walker (2 wheeled) Transfers: Sit to/from Stand Sit to Stand: Mod assist         General transfer comment: cues for UE position  Ambulation/Gait Ambulation/Gait assistance: Min assist Ambulation Distance (Feet): 50 Feet Assistive device: Rolling walker (2 wheeled) Gait Pattern/deviations: Step-to pattern;Decreased step length - right;Decreased stance time - right     General Gait Details: cues for sequence and for increasing weight  Stairs            Wheelchair Mobility    Modified Rankin (Stroke Patients Only)       Balance                                             Pertinent Vitals/Pain Pain Assessment: 0-10 Pain Score: 4  Pain Descriptors / Indicators: Aching    Home Living Family/patient expects to be discharged to:: Private residence Living Arrangements: Spouse/significant other Available Help at Discharge: Family;Available 24 hours/day Type of Home: House Home Access: Stairs to enter Entrance Stairs-Rails: Doctor, general practice of Steps: 4 Home Layout: One level Home Equipment: Walker - 4 wheels;Walker -  standard      Prior Function Level of Independence: Independent               Hand Dominance        Extremity/Trunk Assessment   Upper Extremity Assessment: Overall WFL for tasks assessed           Lower Extremity Assessment: LLE deficits/detail   LLE Deficits / Details: Pt able to advance, decreased weight on L leg.     Communication   Communication: No difficulties  Cognition Arousal/Alertness: Awake/alert Behavior During Therapy: WFL for tasks assessed/performed Overall Cognitive Status: Within Functional Limits for tasks assessed                      General Comments      Exercises        Assessment/Plan    PT Assessment Patient needs continued PT services  PT Diagnosis Difficulty walking;Acute pain   PT Problem List Decreased strength;Decreased range of motion;Decreased activity tolerance;Decreased mobility;Decreased knowledge of use of DME;Decreased safety awareness;Decreased knowledge of precautions;Impaired sensation;Pain  PT Treatment Interventions DME instruction;Gait training;Stair training;Functional mobility training;Therapeutic activities;Therapeutic exercise;Patient/family education   PT Goals (Current goals can be found in the Care Plan section) Acute Rehab PT Goals Patient Stated Goal: to walk PT Goal Formulation: With patient/family Time For Goal Achievement: 11/25/13 Potential to Achieve Goals: Good    Frequency 7X/week   Barriers to discharge  Co-evaluation               End of Session   Activity Tolerance: Patient tolerated treatment well Patient left: in chair;with call bell/phone within reach;with family/visitor present Nurse Communication: Mobility status         Time: [id number] PT Time Calculation (min): 28 min   Charges:   PT Evaluation $Initial PT Evaluation Tier I: 1 Procedure PT Treatments $Gait Training: 23-37 mins   PT G Codes:          Rada HayHill, Mercie Balsley Elizabeth 11/18/2013, 5:34  PM

## 2013-11-18 NOTE — Op Note (Signed)
NAMBurnadette Pop:  Hemme, Kage                ACCOUNT NO.:  [id number]  MEDICAL RECORD NO.:  [id number]  LOCATION:  1609                         [facility]  PHYSICIAN:  Vanita PandaChristopher Y. Magnus IvanBlackman, M.D.DATE OF BIRTH:  Oct 27, 1955  DATE OF PROCEDURE:  11/18/2013 DATE OF DISCHARGE:                              OPERATIVE REPORT   PREOPERATIVE DIAGNOSIS:  Severe end-stage arthritis and degenerative joint disease, left hip.  POSTOPERATIVE DIAGNOSIS:  Severe end-stage arthritis and degenerative joint disease, left hip.  PROCEDURE:  Left total hip arthroplasty through direct anterior approach.  IMPLANTS:  DePuy Sector Gription acetabular component size 54 with apex hole eliminator guide, size 36+4 neutral polyethylene liner, size 9 Corail femoral component with standard offset, size 36+4 ceramic hip ball.  SURGEON:  Vanita PandaChristopher Y. Magnus IvanBlackman, M.D.  ASSISTING:  Richardean CanalGilbert Clark, PA-C.  ANESTHESIA:  General.  ANTIBIOTICS:  2 g IV Ancef.  ESTIMATED BLOOD LOSS:  500 mL.  COMPLICATIONS:  None.  INDICATIONS:  Mr. Victor Santiago is a very pleasant 58 year old gentleman well known to me.  He has a several year history of debilitating bilateral hip pain with left worse than right.  He has x-rays that shows only left side complete loss of his joint space, periarticular osteophytes, joint space narrowing, and subchondral sclerotic changes.  There are some cystic changes well.  At this point, this affects his activities of daily living, his mobility and quality of life.  He wished to proceed with a total hip arthroplasty.  He understands the risks of acute blood loss anemia, nerve and vessel injury, fracture, infection, dislocation, and DVT.  He understands the goals are decreased pain, improved mobility, and overall improved quality of life.  PROCEDURE DESCRIPTION:  After informed consent was obtained. Appropriate left hip was marked.  He was brought to the operating room. After general anesthesia was  obtained while he was on a stretcher. Traction boots were then applied to his feet and he was actually placed supine on the Hana fracture table with the perineal post in place and both legs in inline skeletal traction devices, but no traction applied. His left operative hip was then prepped and draped with DuraPrep and sterile drapes.  A time-out was called to identify the correct patient, correct left hip.  We then made an incision inferior and posterior to the anterosuperior iliac spine and carried this obliquely down the leg. We dissected down the tensor fascia lata muscle and the tensor fascia was then divided longitudinally, so we could proceed with a direct anterior approach to the hip.  We cauterized the lateral femoral circumflex vessels and then I opened up the capsule in a L-type format and placing Cobra retractors on either side of the femoral neck.  We then made our femoral neck cut with an oscillating saw proximal to the lesser trochanter and completed this on osteotome.  We placed a corkscrew guide in the femoral head and removed the femoral head in its entirety and found to be completely devoid of cartilage.  We then cleaned the acetabular debris and placed a bent Hohmann medially and Cobra retractor laterally.  We cleaned remnants of acetabular labrum as well.  We then began reaming under direct  visualization from a size 42 reamer up to a size 54 in 2 mm increments.  All reamers were placed under direct visualization and the last reamer under direct fluoroscopy, so we could obtain our depth of reaming, our inclination, and anteversion.  I then placed the real DePuy Sector Gription acetabular component size 54, the apex hole eliminator guide and the real 36+4 neutral polyethylene liner.  Attention was then turned to the femur with the leg externally rotated to 100 degrees, extended and adducted.  We were able to use a Mueller retractor medially and a Hohmann  retractor behind the greater trochanter.  I released the lateral joint capsule and then used a box cutting osteotome, femoral canal and rongeur lateralize. We broached for just a size 8 broach up to a size 9 and broached with the Corail broaching system and then trialed a standard neck off of this and a 36+1.5 hip ball reduced this in the acetabulum.  I was pleased with the stability with range of motion as well as the offset and leg lengths measured direct fluoroscopy.  We then dislocated the hip and removed the trial components.  We then placed the real Corail femoral component with standard offset and the real 36+1.5 ceramic hip ball.  We reduced this in the acetabulum again, it was stable.  We copiously irrigated the soft tissues with normal saline solution using pulsatile lavage.  We closed the joint capsule with interrupted #1 Ethibond suture followed by running #1 Vicryl in the tensor fascia, 0 Vicryl deep tissue, 2-0 Vicryl in subcutaneous tissue, 4-0 Monocryl subcuticular stitch and Steri-Strips.  An Aquacel dressing was then applied.  He was taken off the Hana table, awakened, extubated, and taken to recovery room in stable condition.  All final counts were correct.  There were no complications noted.  Of note, Richardean Canal, PA-C assisted during the entire case and his assistance was crucial for completing this case.     Vanita Panda. Magnus Ivan, M.D.     CYB/MEDQ  D:  11/18/2013  T:  11/18/2013  Job:  [id number]

## 2013-11-18 NOTE — Addendum Note (Signed)
Addendum created 11/18/13 1413 by Gaetano Hawthorneharles L Costantino Kohlbeck, MD   Modules edited: Orders

## 2013-11-19 LAB — BASIC METABOLIC PANEL
Anion gap: 12 (ref 5–15)
BUN: 13 mg/dL (ref 6–23)
CALCIUM: 9 mg/dL (ref 8.4–10.5)
CO2: 25 mEq/L (ref 19–32)
CREATININE: 1 mg/dL (ref 0.50–1.35)
Chloride: 99 mEq/L (ref 96–112)
GFR, EST NON AFRICAN AMERICAN: 82 mL/min — AB (ref >90)
Glucose, Bld: 176 mg/dL — ABNORMAL HIGH (ref 70–99)
Potassium: 4.4 mEq/L (ref 3.7–5.3)
Sodium: 136 mEq/L — ABNORMAL LOW (ref 137–147)

## 2013-11-19 LAB — CBC
HCT: 32.7 % — ABNORMAL LOW (ref 39.0–52.0)
Hemoglobin: 11.6 g/dL — ABNORMAL LOW (ref 13.0–17.0)
MCH: 31.4 pg (ref 26.0–34.0)
MCHC: 35.5 g/dL (ref 30.0–36.0)
MCV: 88.4 fL (ref 78.0–100.0)
PLATELETS: 222 10*3/uL (ref 150–400)
RBC: 3.7 MIL/uL — ABNORMAL LOW (ref 4.22–5.81)
RDW: 12.7 % (ref 11.5–15.5)
WBC: 12.8 10*3/uL — AB (ref 4.0–10.5)

## 2013-11-19 NOTE — Progress Notes (Signed)
Subjective: 1 Day Post-Op Procedure(s) (LRB): LEFT TOTAL HIP ARTHROPLASTY ANTERIOR APPROACH (Left) Patient reports pain as moderate.  Has been up with therapy.  Objective: Vital signs in last 24 hours: Temp:  [97 F (36.1 C)-98.3 F (36.8 C)] 98.1 F (36.7 C) (08/15 0508) Pulse Rate:  [63-93] 80 (08/15 0508) Resp:  [12-20] 14 (08/14 2208) BP: (113-156)/(69-99) 137/84 mmHg (08/15 0508) SpO2:  [98 %-100 %] 100 % (08/15 0508) FiO2 (%):  [100 %] 100 % (08/14 1445) Weight:  [63.05 kg (139 lb)] 63.05 kg (139 lb) (08/14 1445)  Intake/Output from previous day: 08/14 0701 - 08/15 0700 In: 3557.5 [P.O.:600; I.V.:2747.5; IV Piggyback:210] Out: 2200 [Urine:1700; Blood:500] Intake/Output this shift: Total I/O In: 420 [P.O.:420] Out: 225 [Urine:225]   Recent Labs  11/19/13 0540  HGB 11.6*    Recent Labs  11/19/13 0540  WBC 12.8*  RBC 3.70*  HCT 32.7*  PLT 222    Recent Labs  11/19/13 0540  NA 136*  K 4.4  CL 99  CO2 25  BUN 13  CREATININE 1.00  GLUCOSE 176*  CALCIUM 9.0   No results found for this basename: LABPT, INR,  in the last 72 hours  Sensation intact distally Intact pulses distally Dorsiflexion/Plantar flexion intact Incision: scant drainage  Assessment/Plan: 1 Day Post-Op Procedure(s) (LRB): LEFT TOTAL HIP ARTHROPLASTY ANTERIOR APPROACH (Left) Up with therapy Discharge home with home health likely Monday  Tashea Othman Y 11/19/2013, 11:29 AM

## 2013-11-19 NOTE — Progress Notes (Signed)
CARE MANAGEMENT NOTE 11/19/2013  Patient:  Victor Santiago,Victor Santiago   Account Number:  [id number]  Date Initiated:  11/18/2013  Documentation initiated by:  Santiago,Victor  Subjective/Objective Assessment:   left hip replacement ant approach     Action/Plan:   home with dme and hhc   Anticipated DC Date:  11/21/2013   Anticipated DC Plan:  HOME W HOME HEALTH SERVICES  In-house referral  NA      DC Planning Services  CM consult      [facility]  NA   Choice offered to / List presented to:  C-1 Patient   DME arranged  3-N-1  [facility]      DME agency  [facility]     HH arranged  HH-2 PT      [facility]   Status of service:  Completed, signed off Medicare Important Message given?  NA - LOS <3 / Initial given by admissions (If response is "NO", the following Medicare IM given date fields will be blank) Date Medicare IM given:   Medicare IM given by:   Date Additional Medicare IM given:   Additional Medicare IM given by:    Discharge Disposition:  HOME W HOME HEALTH SERVICES  Per UR Regulation:  Reviewed for med. necessity/level of care/duration of stay  If discussed at Long Length of Stay Meetings, dates discussed:    Comments:  11/19/2013 1600 NCM contacted [facility] for DME for home. Will deliver on 8/16. Victor DonningAlesia Krystalyn Kubota RN CCM Case Mgmt phone [phone]    [facility]Victor Davis,RN,BSN,CCM

## 2013-11-19 NOTE — Progress Notes (Signed)
Physical Therapy Treatment Note   11/19/13 1500  PT Visit Information  Last PT Received On 11/19/13  Assistance Needed +1  History of Present Illness L direct anterior THA  PT Time Calculation  PT Start Time 1431  PT Stop Time 1448  PT Time Calculation (min) 17 min  Subjective Data  Subjective Pt ambulated again in hallway and then performed steps.  Pt mobility improving.  Precautions  Precautions Fall  Restrictions  Weight Bearing Restrictions No  Pain Assessment  Pain Assessment 0-10  Pain Score 3  Pain Location L hip  Pain Descriptors / Indicators Aching;Sore  Pain Intervention(s) Limited activity within patient's tolerance;Premedicated before session;Ice applied  Cognition  Arousal/Alertness Awake/alert  Behavior During Therapy WFL for tasks assessed/performed  Overall Cognitive Status Within Functional Limits for tasks assessed  Bed Mobility  Overal bed mobility Needs Assistance  Bed Mobility Supine to Sit;Sit to Supine  Supine to sit Supervision  Sit to supine Supervision  General bed mobility comments verbal cues for self assist  Transfers  Overall transfer level Needs assistance  Equipment used Rolling walker (2 wheeled)  Transfers Sit to/from Stand  Sit to Stand Supervision  General transfer comment verbal cues for UE and LE positioning  Ambulation/Gait  Ambulation/Gait assistance Min guard  Ambulation Distance (Feet) 140 Feet  Assistive device Rolling walker (2 wheeled)  Gait Pattern/deviations Step-to pattern;Decreased step length - left;Decreased stance time - left  General Gait Details verbal cues for heel strike  Stairs Yes  Stairs assistance Min guard  Stair Management Backwards;With walker;Step to pattern  Number of Stairs 2  General stair comments verbal cues for sequence, safety, RW positioning  PT - End of Session  Activity Tolerance Patient tolerated treatment well  Patient left in bed;with call bell/phone within reach;with family/visitor  present  PT - Assessment/Plan  PT Plan Current plan remains appropriate  PT Frequency 7X/week  Follow Up Recommendations Home health PT;Supervision/Assistance - 24 hour  PT equipment Rolling walker with 5" wheels (pt may just use SW he already has at home)  PT Goal Progression  Progress towards PT goals Progressing toward goals  PT General Charges  $$ ACUTE PT VISIT 1 Procedure  PT Treatments  $Gait Training 8-22 mins   Zenovia JarredKati Zenola Dezarn, PT, DPT 11/19/2013 Pager: [phone]

## 2013-11-19 NOTE — Progress Notes (Signed)
Physical Therapy Treatment Patient Details Name: GARO HEIDELBERG MRN: [id number] DOB: 1955-05-17 Today's Date: 11/19/2013    History of Present Illness L direct anterior THA    PT Comments    Pt ambulated in hallway and performed exercises.  Pt also with many questions about home set up so discussed safety with high bed and pillow positioning, answered all pt questions.  Pt plans to ambulate again this afternoon and practice steps.   Follow Up Recommendations  Home health PT;Supervision/Assistance - 24 hour     Equipment Recommendations  Rolling walker with 5" wheels (pt may just use SW he already has at home)    Recommendations for Other Services       Precautions / Restrictions Precautions Precautions: Fall Restrictions Weight Bearing Restrictions: No    Mobility  Bed Mobility Overal bed mobility: Needs Assistance Bed Mobility: Supine to Sit;Sit to Supine     Supine to sit: Min guard Sit to supine: Min assist   General bed mobility comments: verbal cues for self assist  Transfers Overall transfer level: Needs assistance Equipment used: Rolling walker (2 wheeled) Transfers: Sit to/from Stand Sit to Stand: Min guard         General transfer comment: verbal cues for UE and LE positioning  Ambulation/Gait Ambulation/Gait assistance: Min guard Ambulation Distance (Feet): 120 Feet Assistive device: Rolling walker (2 wheeled) Gait Pattern/deviations: Step-to pattern;Decreased step length - left;Decreased stance time - left     General Gait Details: verbal cues for sequence, RW position, WB   Stairs            Wheelchair Mobility    Modified Rankin (Stroke Patients Only)       Balance                                    Cognition Arousal/Alertness: Awake/alert Behavior During Therapy: WFL for tasks assessed/performed Overall Cognitive Status: Within Functional Limits for tasks assessed                      Exercises  Total Joint Exercises Ankle Circles/Pumps: AROM;Both;15 reps Quad Sets: AROM;Both;15 reps Gluteal Sets: AROM;Both;15 reps Short Arc QuadBarbaraann Boys;Left;15 reps Heel Slides: AAROM;Left;15 reps Hip ABduction/ADduction: AAROM;Left;15 reps Straight Leg Raises: AAROM;Left;10 reps    General Comments        Pertinent Vitals/Pain Pain Assessment: 0-10 Pain Score: 7  Pain Location: L hip Pain Descriptors / Indicators: Aching;Sore Pain Intervention(s): Limited activity within patient's tolerance;RN gave pain meds during session;Repositioned;Ice applied    Home Living Family/patient expects to be discharged to:: Private residence Living Arrangements: Spouse/significant other Available Help at Discharge: Family;Available 24 hours/day Type of Home: House       Home Equipment: Walker - 4 wheels;Walker - standard      Prior Function Level of Independence: Independent          PT Goals (current goals can now be found in the care plan section) Acute Rehab PT Goals Patient Stated Goal: to walk Progress towards PT goals: Progressing toward goals    Frequency  7X/week    PT Plan Current plan remains appropriate    Co-evaluation             End of Session   Activity Tolerance: Patient tolerated treatment well Patient left: with call bell/phone within reach;in bed     Time: 1324-4010 PT Time Calculation (min): 43 min  Charges:  $  Gait Training: 8-22 mins $Therapeutic Exercise: 8-22 mins $Self Care/Home Management: 8-22                    G Codes:      Hideko Esselman,KATHrine E 11/19/2013, 1:40 PM Zenovia JarredKati Elie Gragert, PT, DPT 11/19/2013 Pager: [phone]

## 2013-11-19 NOTE — Progress Notes (Signed)
Occupational Therapy Evaluation Patient Details Name: Victor Santiago MRN: [id number] DOB: 1955/08/22 Today's Date: 11/19/2013    History of Present Illness Victor Santiago   Clinical Impression   Patient presents to OT with decreased ADL independence s/p Victor Santiago. Will benefit from skilled OT to maximize independence and facilitate safe discharge.    Follow Up Recommendations  No OT follow up;Supervision - Intermittent    Equipment Recommendations  3 in 1 bedside comode    Recommendations for Other Services       Precautions / Restrictions Precautions Precautions: Fall Restrictions Weight Bearing Restrictions: No      Mobility Bed Mobility                  Transfers                      Balance                                            ADL Overall ADL's : Needs assistance/impaired Eating/Feeding: Independent   Grooming: Set up   Upper Body Bathing: Set up   Lower Body Bathing: Minimal assistance   Upper Body Dressing : Set up   Lower Body Dressing: Moderate assistance               Functional mobility during ADLs:  (pt declined mobilizing, recently worked with PT) General ADL Comments: Patient has difficulty reaching lower legs/feet and would benefit from AE. Will address next session.     Vision                     Perception     Praxis      Pertinent Vitals/Pain Pain Assessment: No/denies pain     Hand Dominance Right   Extremity/Trunk Assessment Upper Extremity Assessment Upper Extremity Assessment: Overall WFL for tasks assessed   Lower Extremity Assessment Lower Extremity Assessment: Defer to PT evaluation       Communication Communication Communication: No difficulties   Cognition Arousal/Alertness: Awake/alert Behavior During Therapy: WFL for tasks assessed/performed Overall Cognitive Status: Within Functional Limits for tasks assessed                     General Comments        Exercises       Shoulder Instructions      Home Living Family/patient expects to be discharged to:: Private residence Living Arrangements: Spouse/significant other Available Help at Discharge: Family;Available 24 hours/day Type of Home: House             Bathroom Shower/Tub: Chief Strategy Officer: Standard     Home Equipment: Environmental consultant - 4 wheels;Walker - standard          Prior Functioning/Environment Level of Independence: Independent             OT Diagnosis: Acute pain   OT Problem List: Decreased range of motion;Decreased knowledge of use of DME or AE;Pain   OT Treatment/Interventions: Self-care/ADL training;DME and/or AE instruction;Therapeutic activities;Patient/family education    OT Goals(Current goals can be found in the care plan section) Acute Rehab OT Goals Patient Stated Goal: to walk OT Goal Formulation: With patient Time For Goal Achievement: 12/03/13 Potential to Achieve Goals: Good  OT Frequency: Min 2X/week   Barriers to D/C:  Co-evaluation              End of Session    Activity Tolerance: Patient tolerated treatment well Patient left: in bed;with call bell/phone within reach;with family/visitor present   Time: 8657-84691039-1053 OT Time Calculation (min): 14 min Charges:  OT General Charges $OT Visit: 1 Procedure OT Evaluation $Initial OT Evaluation Tier I: 1 Procedure OT Treatments $Self Care/Home Management : 8-22 mins G-Codes:    Victor Santiago A 11/19/2013, 10:59 AM

## 2013-11-20 LAB — CBC
HCT: 31.9 % — ABNORMAL LOW (ref 39.0–52.0)
Hemoglobin: 11.2 g/dL — ABNORMAL LOW (ref 13.0–17.0)
MCH: 31.9 pg (ref 26.0–34.0)
MCHC: 35.1 g/dL (ref 30.0–36.0)
MCV: 90.9 fL (ref 78.0–100.0)
Platelets: 193 10*3/uL (ref 150–400)
RBC: 3.51 MIL/uL — ABNORMAL LOW (ref 4.22–5.81)
RDW: 13.2 % (ref 11.5–15.5)
WBC: 12.2 10*3/uL — ABNORMAL HIGH (ref 4.0–10.5)

## 2013-11-20 MED ORDER — OXYCODONE-ACETAMINOPHEN 5-325 MG PO TABS: 1.0000 | ORAL_TABLET | ORAL | Status: DC | PRN

## 2013-11-20 MED ORDER — ASPIRIN 325 MG PO TBEC: 325.0000 mg | DELAYED_RELEASE_TABLET | Freq: Two times a day (BID) | ORAL | Status: DC

## 2013-11-20 MED ORDER — METHOCARBAMOL 500 MG PO TABS: 500.0000 mg | ORAL_TABLET | Freq: Four times a day (QID) | ORAL | Status: DC | PRN

## 2013-11-20 NOTE — Progress Notes (Signed)
11/20/13 1600  PT Visit Information  Last PT Received On 11/20/13  Assistance Needed +1  History of Present Illness L direct anterior THA   Pt became very emotional, tearful this pm, he required incr time for all tasks and cues for sequence often forgetting what to do at times;   PT Time Calculation  PT Start Time 1620  PT Stop Time 1643  PT Time Calculation (min) 23 min  Subjective Data  Subjective pt in recliner  Patient Stated Goal to walk  Precautions  Precautions Fall  Restrictions  Weight Bearing Restrictions No  Pain Assessment  Pain Assessment 0-10  Pain Score 8  Pain Location left hip  Pain Descriptors / Indicators Pins and needles  Pain Intervention(s) Monitored during session;Premedicated before session;Ice applied;Limited activity within patient's tolerance  Cognition  Arousal/Alertness Awake/alert  Behavior During Therapy WFL for tasks assessed/performed  Overall Cognitive Status Within Functional Limits for tasks assessed  Transfers  Overall transfer level Needs assistance  Equipment used Rolling walker (2 wheeled)  Transfers Sit to/from Stand  Sit to Stand Supervision  General transfer comment verbal cues for UE and LE positioning  Ambulation/Gait  Ambulation/Gait assistance Supervision;Min guard  Ambulation Distance (Feet) 45 Feet  Assistive device Rolling walker (2 wheeled)  Gait Pattern/deviations Step-to pattern;Decreased step length - right;Decreased step length - left;Antalgic  General Gait Details pt requring greatly incr time to amb this pm as compared to am; requiring assist at times to advance LLE due to pain; cues for sequence  Total Joint Exercises  Ankle Circles/Pumps AROM;Both;15 reps  Straight Leg Raises Other (comment) (too much pain to complete ther ex)  Long Arc Quad AROM;AAROM;Strengthening;Left;10 reps  PT - End of Session  Equipment Utilized During Treatment Gait belt  Activity Tolerance Patient limited by pain  Patient left in  chair;with call bell/phone within reach  Nurse Communication Mobility status  PT - Assessment/Plan  PT Plan Current plan remains appropriate  PT Frequency 7X/week  Follow Up Recommendations Home health PT;Supervision/Assistance - 24 hour  PT equipment Rolling walker with 5" wheels  PT Goal Progression  Progress towards PT goals Progressing toward goals  Acute Rehab PT Goals  PT Goal Formulation With patient/family  Time For Goal Achievement 11/25/13  Potential to Achieve Goals Good

## 2013-11-20 NOTE — Discharge Instructions (Signed)
Pickup stool softener for constipation. °Weight Bearing as tolerated  °No hip precautions °Progress activities slowly °Expect hip and possible knee soreness and swelling. Apply heat or ice as needed. °Keep dressing clean dry and intact, may shower with dressing intact. On Friday remove dressing, incision can get wet. After showering apply clean dressing. °

## 2013-11-20 NOTE — Progress Notes (Signed)
Patient ID: Victor Santiago, male   DOB: 04/21/1955, 58 y.o.   MRN: [id number] Postoperative day 2 total hip arthroplasty. Patient is ambulating the hall. Plan for discharge to home tomorrow.

## 2013-11-20 NOTE — Progress Notes (Signed)
Occupational Therapy Treatment Patient Details Name: Victor Santiago MRN: [id number] DOB: 04/29/1955 Today's Date: 11/20/2013    History of present illness L direct anterior THA   OT comments  Patient limited by pain this session. Issued and educated on AE for ADLs this date. All questions regarding this task answered. Continue OT to address ADL transfers.  Follow Up Recommendations  No OT follow up;Supervision - Intermittent    Equipment Recommendations  3 in 1 bedside comode    Recommendations for Other Services      Precautions / Restrictions Precautions Precautions: Fall Restrictions Weight Bearing Restrictions: No       Mobility Bed Mobility                  Transfers                      Balance                                   ADL                                         General ADL Comments: Issued and educated on adaptive equipment for LB self-care. Issued reacher, long sponge, sock aid, and long shoe horn. Demonstrated all to patient. Patient declined return demo at this time. Patient reports too much pain to try transfers this session. Patient highly repetitive and asks a lot of questions. Perseverates on his financial difficulties. Patient reports he will borrow walker and 3 in 1 from a friend. Patient asking about shower chair, told pt not usually covered by insurance and he could sponge bathe until he praticed tub/shower transfers with home health therapies.      Vision                     Perception     Praxis      Cognition   Behavior During Therapy: WFL for tasks assessed/performed Overall Cognitive Status: Within Functional Limits for tasks assessed                       Extremity/Trunk Assessment               Exercises     Shoulder Instructions       General Comments      Pertinent Vitals/ Pain       Pain Assessment: 0-10 Pain Score: 6  Pain Location: L  hip Pain Descriptors / Indicators: Aching;Sore Pain Intervention(s): Limited activity within patient's tolerance;RN gave pain meds during session  Home Living                                          Prior Functioning/Environment              Frequency Min 2X/week     Progress Toward Goals  OT Goals(current goals can now be found in the care plan section)  Progress towards OT goals: Progressing toward goals     Plan Discharge plan remains appropriate    Co-evaluation                 End of Session Equipment Utilized  During Treatment: Other (comment) (adaptive equipment)   Activity Tolerance Patient limited by pain   Patient Left in bed;with call bell/phone within reach;with nursing/sitter in room   Nurse Communication Patient requests pain meds        Time: 0930-1000 OT Time Calculation (min): 30 min  Charges: OT General Charges $OT Visit: 1 Procedure OT Treatments $Self Care/Home Management : 23-37 mins  Victor Santiago A 11/20/2013, 11:34 AM

## 2013-11-20 NOTE — Progress Notes (Signed)
Physical Therapy Treatment Patient Details Name: Victor Santiago S Pottenger MRN: [id number] DOB: 07/24/1955 Today's Date: 11/20/2013    History of Present Illness L direct anterior THA    PT Comments    Progressing; stair training with crutches as pt states he is very concerned about his wife helping with the walker  Follow Up Recommendations  Home health PT;Supervision/Assistance - 24 hour     Equipment Recommendations  Rolling walker with 5" wheels    Recommendations for Other Services       Precautions / Restrictions Precautions Precautions: Fall Restrictions Weight Bearing Restrictions: No    Mobility  Bed Mobility                  Transfers Overall transfer level: Needs assistance Equipment used: Rolling walker (2 wheeled) Transfers: Sit to/from Stand Sit to Stand: Supervision         General transfer comment: verbal cues for UE and LE positioning  Ambulation/Gait Ambulation/Gait assistance: Supervision Ambulation Distance (Feet): 120 Feet Assistive device: Rolling walker (2 wheeled) Gait Pattern/deviations: Step-through pattern;Step-to pattern;Antalgic     General Gait Details: verbal cues for heel strike and to keep heel down during stance, cues to progress to step through   Stairs   Stairs assistance: Min assist Stair Management: Step to pattern;Forwards;With crutches Number of Stairs: 8 General stair comments: verbal cues for sequence, safety, technique  Wheelchair Mobility    Modified Rankin (Stroke Patients Only)       Balance                                    Cognition Arousal/Alertness: Awake/alert Behavior During Therapy: WFL for tasks assessed/performed Overall Cognitive Status: Within Functional Limits for tasks assessed                      Exercises      General Comments        Pertinent Vitals/Pain Pain Assessment: 0-10 Pain Score: 4  Pain Location: L hip Pain Intervention(s): Monitored during  session;Premedicated before session    Home Living                      Prior Function            PT Goals (current goals can now be found in the care plan section) Acute Rehab PT Goals Patient Stated Goal: to walk PT Goal Formulation: With patient/family Time For Goal Achievement: 11/25/13 Potential to Achieve Goals: Good Progress towards PT goals: Progressing toward goals    Frequency  7X/week    PT Plan Current plan remains appropriate    Co-evaluation             End of Session Equipment Utilized During Treatment: Gait belt Activity Tolerance: Patient tolerated treatment well Patient left: in chair;with call bell/phone within reach     Time: 1022-1056 PT Time Calculation (min): 34 min  Charges:  $Gait Training: 23-37 mins                    G Codes:      Jiovanna Frei 11/20/2013, 3:38 PM

## 2013-11-20 NOTE — Plan of Care (Signed)
Problem: Phase III Progression Outcomes Goal: Anticoagulant follow-up in place Outcome: Not Applicable Date Met:  84/72/07 asa

## 2013-11-21 LAB — CBC
HCT: 31.6 % — ABNORMAL LOW (ref 39.0–52.0)
Hemoglobin: 11.1 g/dL — ABNORMAL LOW (ref 13.0–17.0)
MCH: 31.4 pg (ref 26.0–34.0)
MCHC: 35.1 g/dL (ref 30.0–36.0)
MCV: 89.5 fL (ref 78.0–100.0)
PLATELETS: 206 10*3/uL (ref 150–400)
RBC: 3.53 MIL/uL — AB (ref 4.22–5.81)
RDW: 13.1 % (ref 11.5–15.5)
WBC: 10.6 10*3/uL — AB (ref 4.0–10.5)

## 2013-11-21 NOTE — Progress Notes (Signed)
Occupational Therapy Treatment Patient Details Name: Victor Santiago MRN: [id number] DOB: 04-Dec-1955 Today's Date: 11/21/2013    History of present illness L direct anterior THA   OT comments  Further educated on AE and practiced with his AE in room. Also worked on safety with functional transfers. Wife present.   Follow Up Recommendations  No OT follow up    Equipment Recommendations  3 in 1 bedside comode    Recommendations for Other Services      Precautions / Restrictions Precautions Precautions: Fall Restrictions Weight Bearing Restrictions: No       Mobility Bed Mobility                  Transfers Overall transfer level: Needs assistance Equipment used: Rolling walker (2 wheeled) Transfers: Sit to/from Stand Sit to Stand: Min guard         General transfer comment: verbal cues for UE and LE positioning    Balance                                   ADL                       Lower Body Dressing: Moderate assistance;With adaptive equipment;Sit to/from stand                 General ADL Comments: Pt coming out of bathroom when OT arrived and wife present. Pt assisted to chair. Pt requesting to allow wife to assist with sponge bath after he ate breakfast so OT returned to assist with getting dressed after these activities. Pt states he has a tub at home so he plans to sponge bathe initially as he is having difficulty picking up L LE off the floor to slide into pants, etc. Feel this is best option for pt also and explained that  it is safer to spone bathe until he can clear L LE off the floor at the bathtub height. Educated further and practiced with AE for LB self care. Pt needed mod assist even with AE but feel he will get better with practice. He verbalizes understanding of techniques and wife observed and verbalized understanding. He needed assist to slide L foot through sock aid fully due to ? some edema. He also needed some  assist with placement of reacher on waistband of clothes to properly don over LEs. Finally he needed assist with shoes due to laces and L shoe fitting a little tighter. Recommended slide in shoe if possible at home to make it easier. Verbal cues for saefty with hand placement for standing and also to have walker in front of him when he gets up.       Vision                     Perception     Praxis      Cognition   Behavior During Therapy: [place] for tasks assessed/performed Overall Cognitive Status: Within Functional Limits for tasks assessed                       Extremity/Trunk Assessment               Exercises     Shoulder Instructions       General Comments      Pertinent Vitals/ Pain       Pain Assessment:  No/denies pain  Home Living                                          Prior Functioning/Environment              Frequency Min 2X/week     Progress Toward Goals  OT Goals(current goals can now be found in the care plan section)  Progress towards OT goals: Progressing toward goals     Plan Discharge plan remains appropriate    Co-evaluation                 End of Session Equipment Utilized During Treatment: Rolling walker   Activity Tolerance Patient tolerated treatment well   Patient Left in chair;with call bell/phone within reach;with family/visitor present   Nurse Communication          Time: [id number] OT Time Calculation (min): 41 min  Charges: OT General Charges $OT Visit: 1 Procedure OT Treatments $Self Care/Home Management : 23-37 mins $Therapeutic Activity: 8-22 mins  Lennox LaityStone, Jaila Schellhorn Stafford [phone] 11/21/2013, 11:51 AM

## 2013-11-21 NOTE — Progress Notes (Signed)
[facility] providing the following services: RW and Commode  If patient discharges after hours, please call [phone].   Renard HamperLecretia Williamson 11/21/2013, 9:43 AM

## 2013-11-21 NOTE — Progress Notes (Signed)
Physical Therapy Treatment Patient Details Name: Victor Santiago S Popowski MRN: [id number] DOB: 01/25/1956 Today's Date: 11/21/2013    History of Present Illness L direct anterior THA    PT Comments    POD # 3 assisted with amb, practiceing stairs and educated on home safety.  Pt required increased time and had many questions.  Pt ready for D/C to home.  Follow Up Recommendations  Home health PT;Supervision/Assistance - 24 hour     Equipment Recommendations  Rolling walker with 5" wheels (one critch issued for stairs)    Recommendations for Other Services       Precautions / Restrictions Precautions Precautions: Fall Restrictions Weight Bearing Restrictions: No    Mobility  Bed Mobility               General bed mobility comments: Pt OOB in recliner  Transfers Overall transfer level: Needs assistance Equipment used: Rolling walker (2 wheeled) Transfers: Sit to/from Stand Sit to Stand: Min guard         General transfer comment: 25% VC's on safety with turn completion and hand palcement   Ambulation/Gait Ambulation/Gait assistance: Supervision;Min guard Ambulation Distance (Feet): 22 Feet Assistive device: Rolling walker (2 wheeled) Gait Pattern/deviations: Step-to pattern;Decreased step length - right;Decreased step length - left;Narrow base of support;Trunk flexed Gait velocity: decreased   General Gait Details: increased time with very short step length B LE and tight ADdusction with slightlt flex hips and trunk forward.  VC's on upright posture and increase ABduction B LE's to increase BOS and balance.  Very slwo gait.     Stairs Stairs: Yes   Stair Management: One rail Left;Step to pattern;Forwards;With crutches Number of Stairs: 4 General stair comments: 50% VC's on proper tech and safety.    Wheelchair Mobility    Modified Rankin (Stroke Patients Only)       Balance                                    Cognition Arousal/Alertness:  Awake/alert Behavior During Therapy: WFL for tasks assessed/performed Overall Cognitive Status: Within Functional Limits for tasks assessed                      Exercises      General Comments General comments (skin integrity, edema, etc.): min guard to pull up pants.      Pertinent Vitals/Pain Pain Assessment: No/denies pain    Home Living                      Prior Function            PT Goals (current goals can now be found in the care plan section) Progress towards PT goals: Progressing toward goals    Frequency  7X/week    PT Plan      Co-evaluation             End of Session Equipment Utilized During Treatment: Gait belt Activity Tolerance: Patient tolerated treatment well (slow) Patient left: in chair;with call bell/phone within reach     Time: 1205-1245 PT Time Calculation (min): 40 min  Charges:  $Gait Training: 8-22 mins $Therapeutic Activity: 8-22 mins $Self Care/Home Management: 8-22                    G Codes:      Felecia ShellingLori Rodrigus Kilker  PTA [facility]  Acute  Rehab  Pager      [phone]

## 2013-11-21 NOTE — Progress Notes (Signed)
Patient ID: Victor Santiago, male   DOB: 10/23/1955, 58 y.o.   MRN: [id number] Doing very well.  Can discharge to home today.

## 2013-11-21 NOTE — Discharge Summary (Signed)
Patient ID: Victor Royaldward S Backstrom MRN: [id number] DOB/AGE: 57/05/1955 57 y.o.  Admit date: 11/18/2013 Discharge date: 11/21/2013  Admission Diagnoses:  Principal Problem:   Arthritis of left hip Active Problems:   Status post total replacement of left hip   Discharge Diagnoses:  Same  Past Medical History  Diagnosis Date  . Hypertension   . Arthritis     Surgeries: Procedure(s): LEFT TOTAL HIP ARTHROPLASTY ANTERIOR APPROACH on 11/18/2013   Consultants:    Discharged Condition: Improved  Hospital Course: Victor Santiago is an 58 y.o. male who was admitted 11/18/2013 for operative treatment ofArthritis of left hip. Patient has severe unremitting pain that affects sleep, daily activities, and work/hobbies. After pre-op clearance the patient was taken to the operating room on 11/18/2013 and underwent  Procedure(s): LEFT TOTAL HIP ARTHROPLASTY ANTERIOR APPROACH.    Patient was given perioperative antibiotics: Anti-infectives   Start     Dose/Rate Route Frequency Ordered Stop   11/18/13 1800  ceFAZolin (ANCEF) IVPB 1 g/50 mL premix     1 g 100 mL/hr over 30 Minutes Intravenous Every 6 hours 11/18/13 1513 11/19/13 0046   11/18/13 1000  ceFAZolin (ANCEF) IVPB 2 g/50 mL premix     2 g 100 mL/hr over 30 Minutes Intravenous On call to O.R. 11/18/13 0944 11/18/13 1120       Patient was given sequential compression devices, early ambulation, and chemoprophylaxis to prevent DVT.  Patient benefited maximally from hospital stay and there were no complications.    Recent vital signs: Patient Vitals for the past 24 hrs:  BP Temp Temp src Pulse Resp SpO2  11/21/13 0545 106/68 mmHg 98.3 F (36.8 C) Oral 62 20 100 %  11/20/13 2120 132/80 mmHg 99.1 F (37.3 C) Oral 73 20 99 %  11/20/13 1415 128/86 mmHg 98.2 F (36.8 C) Oral 83 16 100 %     Recent laboratory studies:  Recent Labs  11/19/13 0540 11/20/13 0548 11/21/13 0422  WBC 12.8* 12.2* 10.6*  HGB 11.6* 11.2* 11.1*  HCT 32.7* 31.9*  31.6*  PLT 222 193 206  NA 136*  --   --   K 4.4  --   --   CL 99  --   --   CO2 25  --   --   BUN 13  --   --   CREATININE 1.00  --   --   GLUCOSE 176*  --   --   CALCIUM 9.0  --   --      Discharge Medications:     Medication List    STOP taking these medications       meloxicam 15 MG tablet  Commonly known as:  MOBIC     traMADol 50 MG tablet  Commonly known as:  ULTRAM      TAKE these medications       amLODipine-benazepril 5-20 MG per capsule  Commonly known as:  LOTREL  Take 1 capsule by mouth every morning.     aspirin 325 MG EC tablet  Take 1 tablet (325 mg total) by mouth 2 (two) times daily after a meal.     Creatine Powd  Take 1 scoop by mouth daily.     methocarbamol 500 MG tablet  Commonly known as:  ROBAXIN  Take 1 tablet (500 mg total) by mouth every 6 (six) hours as needed for muscle spasms.     multivitamin with minerals Tabs tablet  Take 1 tablet by mouth daily.  oxyCODONE-acetaminophen 5-325 MG per tablet  Commonly known as:  ROXICET  Take 1-2 tablets by mouth every 4 (four) hours as needed for severe pain.        Diagnostic Studies: Dg Hip Complete Left  11/18/2013   CLINICAL DATA:  Left hip arthroplasty  EXAM: LEFT HIP - COMPLETE 2+ VIEW; DG C-ARM 1-60 MIN - NRPT [facility]  COMPARISON:  08/13/2009  FINDINGS: Left hip prosthetic components appear well seated and aligned on the to images submitted. There is no acute fracture or evidence of an operative complication.  IMPRESSION: Well aligned left hip arthroplasty.   Electronically Signed   By: Amie Portland M.D.   On: 11/18/2013 12:26   Dg Pelvis Portable  11/18/2013   CLINICAL DATA:  Left hip replacement  EXAM: PORTABLE PELVIS 1-2 VIEWS  COMPARISON:  None.  FINDINGS: Total hip replacement has been performed on the left. Components appear well positioned. No radiographically detectable complication.  IMPRESSION: Good appearance following left THR.   Electronically Signed   By: Paulina Fusi M.D.    On: 11/18/2013 13:48   Dg Hip Portable 1 View Left  11/18/2013   CLINICAL DATA:  Status post left hip arthroplasty.  EXAM: PORTABLE LEFT HIP - 1 VIEW  COMPARISON:  Current operative images.  FINDINGS: On the lateral view, the femoral acetabular components are well aligned. Prosthetic components are well-seated. There is no acute fracture or evidence of an operative complication.  IMPRESSION: Well aligned left hip prosthesis.   Electronically Signed   By: Amie Portland M.D.   On: 11/18/2013 13:36   Dg C-arm 1-60 Min-no Report  11/18/2013   CLINICAL DATA:  Left hip arthroplasty  EXAM: LEFT HIP - COMPLETE 2+ VIEW; DG C-ARM 1-60 MIN - NRPT [facility]  COMPARISON:  08/13/2009  FINDINGS: Left hip prosthetic components appear well seated and aligned on the to images submitted. There is no acute fracture or evidence of an operative complication.  IMPRESSION: Well aligned left hip arthroplasty.   Electronically Signed   By: Amie Portland M.D.   On: 11/18/2013 12:26    Disposition: to home      Discharge Instructions   Call MD / Call 911    Complete by:  As directed   If you experience chest pain or shortness of breath, CALL 911 and be transported to the hospital emergency room.  If you develope a fever above 101 F, pus (white drainage) or increased drainage or redness at the wound, or calf pain, call your surgeon's office.     Constipation Prevention    Complete by:  As directed   Drink plenty of fluids.  Prune juice may be helpful.  You may use a stool softener, such as Colace (over the counter) 100 mg twice a day.  Use MiraLax (over the counter) for constipation as needed.     Diet - low sodium heart healthy    Complete by:  As directed      Discharge instructions    Complete by:  As directed   Increase activities as comfort allows. Ice as needed for swelling. Take an over-the-counter stool softener twice daily. You can get your current dressing wet daily in the shower. New dry dressing after your  shower this coming Friday     Discharge patient    Complete by:  As directed      Increase activity slowly as tolerated    Complete by:  As directed      Weight bearing as  tolerated    Complete by:  As directed            Follow-up Information   Follow up with [facility]. [facility])    Contact information:   [address] [place] [address] [phone]       Follow up with Kathryne Hitch, MD In 2 weeks.   Specialty:  Orthopedic Surgery   Contact information:   [address] [place] [address] [phone]        Signed: Kathryne Hitch 11/21/2013, 7:36 AM

## 2013-11-21 NOTE — Progress Notes (Signed)
CARE MANAGEMENT NOTE 11/21/2013  Patient:  Victor RoyalYOUNG,Victor Santiago   Account Number:  [id number]  Date Initiated:  11/18/2013  Documentation initiated by:  DAVIS,RHONDA  Subjective/Objective Assessment:   left hip replacement ant approach     Action/Plan:   home with dme and hhc   Anticipated DC Date:  11/21/2013   Anticipated DC Plan:  HOME W HOME HEALTH SERVICES  In-house referral  NA      DC Planning Services  CM consult      [facility]  [facility]   Choice offered to / List presented to:  C-1 Patient   DME arranged  3-N-1  [facility] - [facility]      DME agency  [facility]     HH arranged  HH-2 PT      [facility]   Status of service:  Completed, signed off Medicare Important Message given?   (If response is "NO", the following Medicare IM given date fields will be blank) Date Medicare IM given:   Medicare IM given by:   Date Additional Medicare IM given:   Additional Medicare IM given by:    Discharge Disposition:  HOME W HOME HEALTH SERVICES  Per UR Regulation:  Reviewed for med. necessity/level of care/duration of stay  If discussed at Long Length of Stay Meetings, dates discussed:    Comments:  11/21/2013 0900 Pt gave permission to speak to wife. Wife states pt'Santiago friend has DME and will to loan to pt. She will borrow DME if she has copay for equipment. NCM explained the importance to pt having his own equipment on hand. [facility]. Isidoro DonningAlesia Herman Mell RN CCM Case Mgmt phone  11/19/2013 1600 NCM contacted [facility] for DME for home. Will deliver on 8/16. Isidoro DonningAlesia Cem Kosman RN CCM Case Mgmt phone [phone]    [facility] Methodist Medical CenterRhonda Davis,RN,BSN,CCM

## 2014-01-09 ENCOUNTER — Ambulatory Visit: Payer: Self-pay | Admitting: Family Medicine

## 2014-02-23 ENCOUNTER — Ambulatory Visit: Payer: Self-pay | Admitting: Family Medicine

## 2014-03-15 ENCOUNTER — Ambulatory Visit (INDEPENDENT_AMBULATORY_CARE_PROVIDER_SITE_OTHER): Payer: Medicare HMO | Admitting: Family Medicine

## 2014-03-15 ENCOUNTER — Encounter: Payer: Self-pay | Admitting: Family Medicine

## 2014-03-15 VITALS — BP 130/86 | HR 71 | Temp 98.5°F | Ht 64.0 in | Wt 140.0 lb

## 2014-03-15 DIAGNOSIS — Z0000 Encounter for general adult medical examination without abnormal findings: Secondary | ICD-10-CM | POA: Insufficient documentation

## 2014-03-15 DIAGNOSIS — I10 Essential (primary) hypertension: Secondary | ICD-10-CM

## 2014-03-15 DIAGNOSIS — J069 Acute upper respiratory infection, unspecified: Secondary | ICD-10-CM | POA: Insufficient documentation

## 2014-03-15 DIAGNOSIS — J029 Acute pharyngitis, unspecified: Secondary | ICD-10-CM

## 2014-03-15 LAB — TSH: TSH: 1.488 u[IU]/mL (ref 0.350–4.500)

## 2014-03-15 LAB — CBC
HEMATOCRIT: 44 % (ref 39.0–52.0)
Hemoglobin: 15 g/dL (ref 13.0–17.0)
MCH: 30.7 pg (ref 26.0–34.0)
MCHC: 34.1 g/dL (ref 30.0–36.0)
MCV: 90 fL (ref 78.0–100.0)
MPV: 10.4 fL (ref 9.4–12.4)
Platelets: 276 10*3/uL (ref 150–400)
RBC: 4.89 MIL/uL (ref 4.22–5.81)
RDW: 13.6 % (ref 11.5–15.5)
WBC: 8.5 10*3/uL (ref 4.0–10.5)

## 2014-03-15 LAB — POCT GLYCOSYLATED HEMOGLOBIN (HGB A1C): Hemoglobin A1C: 5.8

## 2014-03-15 MED ORDER — FLUTICASONE PROPIONATE 50 MCG/ACT NA SUSP: 2.0000 | Freq: Every day | NASAL | Status: DC

## 2014-03-15 MED ORDER — GUAIFENESIN ER 600 MG PO TB12: 600.0000 mg | ORAL_TABLET | Freq: Two times a day (BID) | ORAL | Status: DC

## 2014-03-15 NOTE — Assessment & Plan Note (Signed)
At goal on amlodipine-benazepril. Continue current therapy.

## 2014-03-15 NOTE — Assessment & Plan Note (Signed)
Referred to GI for colonoscopy. Will also obtain lipid panel, A1c, CBC, BMP, and TSH today.

## 2014-03-15 NOTE — Assessment & Plan Note (Signed)
Likely viral URI. No fevers, chills, or concerning exam findings. Will prescribe nasal fluticasone and mucinex.

## 2014-03-15 NOTE — Progress Notes (Signed)
Victor Santiago is a 58 y.o. male [facility] with a chief complaint of sore throat. His concerns today include:  HPI:  Sore Throat. Patient has sore throat that started about a week ago. Describes a "tickling" like sensation in his throat. Says that this happens every year around this year. Also with significant non productive cough. No rhinorrhea. No nasal congestion. No wheezes. No fevers or chills. No ear pain  Hypertension BP Readings from Last 3 Encounters:  03/15/14 130/86  11/21/13 106/68  11/09/13 137/88   Compliant with medications-yes, without side effects ROS-Denies any CP, HA, SOB, blurry vision, LE edema, transient weakness, orthopnea, PND.   Healthcare Maintenance Patient has not had prior colonoscopy. Is amenable to scheduling one today.   ROS: As per HPI, otherwise all systems reviewed and are negative.  Past Medical History - Reviewed and updated Patient Active Problem List   Diagnosis Date Noted  . Sore throat 03/15/2014  . HTN (hypertension) 03/15/2014  . Healthcare maintenance 03/15/2014  . Arthritis of left hip 11/18/2013  . Status post total replacement of left hip 11/18/2013    Medications- reviewed and updated Current Outpatient Prescriptions  Medication Sig Dispense Refill  . amLODipine-benazepril (LOTREL) 5-20 MG per capsule Take 1 capsule by mouth every morning.    . Creatine POWD Take 1 scoop by mouth daily.    . Multiple Vitamin (MULTIVITAMIN WITH MINERALS) TABS tablet Take 1 tablet by mouth daily.    . traMADol (ULTRAM) 50 MG tablet Take 100 mg by mouth every 12 (twelve) hours as needed.    Marland Kitchen. aspirin EC 325 MG EC tablet Take 1 tablet (325 mg total) by mouth 2 (two) times daily after a meal. (Patient not taking: Reported on 03/15/2014) 40 tablet 0  . fluticasone (FLONASE) 50 MCG/ACT nasal spray Place 2 sprays into both nostrils daily. 16 g 6  . guaiFENesin (MUCINEX) 600 MG 12 hr tablet Take 1 tablet (600 mg total) by mouth 2  (two) times daily. 30 tablet 0  . methocarbamol (ROBAXIN) 500 MG tablet Take 1 tablet (500 mg total) by mouth every 6 (six) hours as needed for muscle spasms. (Patient not taking: Reported on 03/15/2014) 40 tablet 1  . oxyCODONE-acetaminophen (ROXICET) 5-325 MG per tablet Take 1-2 tablets by mouth every 4 (four) hours as needed for severe pain. (Patient not taking: Reported on 03/15/2014) 60 tablet 0   No current facility-administered medications for this visit.    Objective: Physical Exam: BP 130/86 mmHg  Pulse 71  Temp(Src) 98.5 F (36.9 C) (Oral)  Ht 5\' 4"  (1.626 m)  Wt 140 lb (63.504 kg)  BMI 24.02 kg/m2  Gen: NAD, resting comfortably CV: RRR with no murmurs appreciated HEENT: TM clear bilaterally, oropharynx erythematous with no other lesions Lungs: NWOB, CTAB with no crackles, wheezes, or rhonchi Abdomen: Normal bowel sounds present. Soft, Nontender, Nondistended. Ext: no edema Skin: warm, dry Neuro: grossly normal, moves all extremities  Results for orders placed or performed in visit on 03/15/14 (from the past 72 hour(s))  POCT glycosylated hemoglobin (Hb A1C)     Status: None   Collection Time: 03/15/14  4:38 PM  Result Value Ref Range   Hemoglobin A1C 5.8     A/P: See problem list  Sore throat Likely viral URI. No fevers, chills, or concerning exam findings. Will prescribe nasal fluticasone and mucinex.   HTN (hypertension) At goal on amlodipine-benazepril. Continue current therapy.   Healthcare maintenance Referred to GI for  colonoscopy. Will also obtain lipid panel, A1c, CBC, BMP, and TSH today.     Orders Placed This Encounter  Procedures  . Lipid Panel  . CBC  . Basic Metabolic Panel  . TSH  . Ambulatory referral to Gastroenterology    Referral Priority:  Routine    Referral Type:  Consultation    Referral Reason:  Specialty Services Required    Requested Specialty:  Gastroenterology    Number of Visits Requested:  1  . POCT glycosylated hemoglobin  (Hb A1C)    Meds ordered this encounter  Medications  . traMADol (ULTRAM) 50 MG tablet    Sig: Take 100 mg by mouth every 12 (twelve) hours as needed.  . fluticasone (FLONASE) 50 MCG/ACT nasal spray    Sig: Place 2 sprays into both nostrils daily.    Dispense:  16 g    Refill:  6  . guaiFENesin (MUCINEX) 600 MG 12 hr tablet    Sig: Take 1 tablet (600 mg total) by mouth 2 (two) times daily.    Dispense:  30 tablet    Refill:  0     Caleb M. Jimmey RalphParker, MD [facility] Resident PGY-1 03/15/2014 6:24 PM

## 2014-03-15 NOTE — Patient Instructions (Signed)
Thank you for coming to the clinic today. It was nice seeing you.  For your health care maintenance, we are checking for diabetes, cholesterol, and blood counts today. We will also check your thyroid today. We put in the rferral for your colonoscopy. They will call you to set it up.  Your blood pressure looks good today.  For your throat, we sent in a prescription for a nasal spray and an oral medication to help with the cough. If not getting better within 1-2 weeks, please come back in.  See you again in 3 months.

## 2014-03-16 ENCOUNTER — Telehealth: Payer: Self-pay | Admitting: Family Medicine

## 2014-03-16 ENCOUNTER — Encounter: Payer: Self-pay | Admitting: Gastroenterology

## 2014-03-16 LAB — LIPID PANEL
Cholesterol: 189 mg/dL (ref 0–200)
HDL: 63 mg/dL (ref >39)
LDL Cholesterol: 109 mg/dL — ABNORMAL HIGH (ref 0–99)
TRIGLYCERIDES: 83 mg/dL (ref <150)
Total CHOL/HDL Ratio: 3 Ratio
VLDL: 17 mg/dL (ref 0–40)

## 2014-03-16 LAB — BASIC METABOLIC PANEL
BUN: 16 mg/dL (ref 6–23)
CO2: 26 meq/L (ref 19–32)
Calcium: 10 mg/dL (ref 8.4–10.5)
Chloride: 103 mEq/L (ref 96–112)
Creat: 1.05 mg/dL (ref 0.50–1.35)
GLUCOSE: 98 mg/dL (ref 70–99)
Potassium: 4.4 mEq/L (ref 3.5–5.3)
Sodium: 139 mEq/L (ref 135–145)

## 2014-03-16 NOTE — Telephone Encounter (Signed)
Lab results reviewed. ASCVD 10 year risk of 11.0%. Would likely benefit from moderate to high intensity statin therapy. Can discuss at our next meeting.  Caleb M. Jimmey RalphParker, MD [facility] Resident PGY-1 03/16/2014 1:03 PM

## 2014-03-16 NOTE — Telephone Encounter (Signed)
Pt informed.  He chooses to hold off on medication now and discuss at next appt. Fleeger, Maryjo RochesterJessica Dawn

## 2014-04-25 ENCOUNTER — Ambulatory Visit (AMBULATORY_SURGERY_CENTER): Payer: Self-pay | Admitting: *Deleted

## 2014-04-25 VITALS — Ht 64.0 in | Wt 145.8 lb

## 2014-04-25 DIAGNOSIS — Z1211 Encounter for screening for malignant neoplasm of colon: Secondary | ICD-10-CM

## 2014-04-25 MED ORDER — MOVIPREP 100 G PO SOLR: 1.0000 | Freq: Once | ORAL | Status: DC

## 2014-04-25 NOTE — Progress Notes (Signed)
No egg or soy allergy, no home 02 use, no diet pills, no issues with past sedation. Had hip replacement 11-18-2013 with no issues. ewm emmi video to pt's e mail. ewm

## 2014-05-03 ENCOUNTER — Telehealth: Payer: Self-pay | Admitting: Family Medicine

## 2014-05-03 NOTE — Telephone Encounter (Signed)
Patient left a message on medical records line to let us know that he no longer has [employer].  He now has [employer].

## 2014-05-04 ENCOUNTER — Ambulatory Visit: Payer: Self-pay

## 2014-05-04 ENCOUNTER — Encounter: Payer: Self-pay | Admitting: Gastroenterology

## 2014-05-09 ENCOUNTER — Telehealth: Payer: Self-pay | Admitting: Family Medicine

## 2014-05-09 DIAGNOSIS — Z96642 Presence of left artificial hip joint: Secondary | ICD-10-CM

## 2014-05-09 NOTE — Telephone Encounter (Signed)
Pt called and needs a referral to see Dr. Rayburn MaBlackmon, Myriam Jacobsonjw

## 2014-05-09 NOTE — Telephone Encounter (Signed)
Ortho referral . Victor Santiago, Victor Santiago

## 2014-05-15 ENCOUNTER — Encounter: Payer: Self-pay | Admitting: Gastroenterology

## 2014-05-19 ENCOUNTER — Ambulatory Visit (INDEPENDENT_AMBULATORY_CARE_PROVIDER_SITE_OTHER): Payer: Commercial Managed Care - HMO | Admitting: Family Medicine

## 2014-05-19 ENCOUNTER — Encounter: Payer: Self-pay | Admitting: Family Medicine

## 2014-05-19 VITALS — BP 158/91 | HR 62 | Temp 98.6°F | Ht 64.0 in | Wt 151.0 lb

## 2014-05-19 DIAGNOSIS — I10 Essential (primary) hypertension: Secondary | ICD-10-CM | POA: Diagnosis not present

## 2014-05-19 DIAGNOSIS — Z0000 Encounter for general adult medical examination without abnormal findings: Secondary | ICD-10-CM

## 2014-05-19 DIAGNOSIS — G4700 Insomnia, unspecified: Secondary | ICD-10-CM | POA: Insufficient documentation

## 2014-05-19 NOTE — Patient Instructions (Addendum)
Thank you for coming to the clinic today. It was nice seeing you.  For your fatigue, it is probably due to you not getting enough sleep. We need at least 7-8 hours of uninterrupted sleep per night. Your difficulty sleeping is most likely related to your hip pain. I am hopeful that once your pain is better controlled, you will have better sleep and feel better throughout the day. Please see the below handout about good sleeping habits.  Call the orthopedist to schedule an appointment for your hip replacement evaluation.   Your blood pressure medications are not likely causing you to feel weak or causing you to lose weight. We will continue them today.  You can try over the counter multivitamins to see if that helps with your energy.   You will receive your pneumonia vaccine when you are 59 years old. Other people sometimes get this shot early if they have other risk factors, such as diabetes.   We will see you again in 3-6 months.   Insomnia Insomnia is frequent trouble falling and/or staying asleep. Insomnia can be a long term problem or a short term problem. Both are common. Insomnia can be a short term problem when the wakefulness is related to a certain stress or worry. Long term insomnia is often related to ongoing stress during waking hours and/or poor sleeping habits. Overtime, sleep deprivation itself can make the problem worse. Every little thing feels more severe because you are overtired and your ability to cope is decreased. CAUSES   Stress, anxiety, and depression.  Poor sleeping habits.  Distractions such as TV in the bedroom.  Naps close to bedtime.  Engaging in emotionally charged conversations before bed.  Technical reading before sleep.  Alcohol and other sedatives. They may make the problem worse. They can hurt normal sleep patterns and normal dream activity.  Stimulants such as caffeine for several hours prior to bedtime.  Pain syndromes and shortness of breath  can cause insomnia.  Exercise late at night.  Changing time zones may cause sleeping problems (jet lag). It is sometimes helpful to have someone observe your sleeping patterns. They should look for periods of not breathing during the night (sleep apnea). They should also look to see how long those periods last. If you live alone or observers are uncertain, you can also be observed at a sleep clinic where your sleep patterns will be professionally monitored. Sleep apnea requires a checkup and treatment. Give your caregivers your medical history. Give your caregivers observations your family has made about your sleep.  SYMPTOMS   Not feeling rested in the morning.  Anxiety and restlessness at bedtime.  Difficulty falling and staying asleep. TREATMENT   Your caregiver may prescribe treatment for an underlying medical disorders. Your caregiver can give advice or help if you are using alcohol or other drugs for self-medication. Treatment of underlying problems will usually eliminate insomnia problems.  Medications can be prescribed for short time use. They are generally not recommended for lengthy use.  Over-the-counter sleep medicines are not recommended for lengthy use. They can be habit forming.  You can promote easier sleeping by making lifestyle changes such as:  Using relaxation techniques that help with breathing and reduce muscle tension.  Exercising earlier in the day.  Changing your diet and the time of your last meal. No night time snacks.  Establish a regular time to go to bed.  Counseling can help with stressful problems and worry.  Soothing music and white noise  may be helpful if there are background noises you cannot remove.  Stop tedious detailed work at least one hour before bedtime. HOME CARE INSTRUCTIONS   Keep a diary. Inform your caregiver about your progress. This includes any medication side effects. See your caregiver regularly. Take note of:  Times when you  are asleep.  Times when you are awake during the night.  The quality of your sleep.  How you feel the next day. This information will help your caregiver care for you.  Get out of bed if you are still awake after 15 minutes. Read or do some quiet activity. Keep the lights down. Wait until you feel sleepy and go back to bed.  Keep regular sleeping and waking hours. Avoid naps.  Exercise regularly.  Avoid distractions at bedtime. Distractions include watching television or engaging in any intense or detailed activity like attempting to balance the household checkbook.  Develop a bedtime ritual. Keep a familiar routine of bathing, brushing your teeth, climbing into bed at the same time each night, listening to soothing music. Routines increase the success of falling to sleep faster.  Use relaxation techniques. This can be using breathing and muscle tension release routines. It can also include visualizing peaceful scenes. You can also help control troubling or intruding thoughts by keeping your mind occupied with boring or repetitive thoughts like the old concept of counting sheep. You can make it more creative like imagining planting one beautiful flower after another in your backyard garden.  During your day, work to eliminate stress. When this is not possible use some of the previous suggestions to help reduce the anxiety that accompanies stressful situations. MAKE SURE YOU:   Understand these instructions.  Will watch your condition.  Will get help right away if you are not doing well or get worse. Document Released: 03/21/2000 Document Revised: 06/16/2011 Document Reviewed: 04/21/2007 [facility] Patient Information 2015 [employer] [place]. This information is not intended to replace advice given to you by your health care provider. Make sure you discuss any questions you have with your health care provider.

## 2014-05-19 NOTE — Assessment & Plan Note (Signed)
Colonoscopy scheduled for next month, though patient reported that his copay was $200. Encouraged patient to follow through. Also gave option of hemoccult cards. He recently changed insurance and will call to see if his copay is different.

## 2014-05-19 NOTE — Progress Notes (Signed)
Victor Santiago is a 59 y.o. male who presents to the [facility] today with a chief complaint of fatigue.   HPI:  Fatigue. Patient reports that he feels weak and tired all the time for the past several months. Also feels like he has been losing weight over the past year. Patient reports that he is not sleeping well, mainly due to the pain in his right hip. Of note, the patient is currently planning to meet with his orthopedist to have an evaluation for replacement of his right hip. Patient reports that he usually goes to sleep around 12 to 1, but wakes up at 5 or 6 am. Patient denies any difficulty falling asleep, however endorses that the pain in his hip is keeping up. Patient denies caffeine use.   Hypertension BP Readings from Last 3 Encounters:  05/19/14 158/91  03/15/14 130/86  11/21/13 106/68   Home BP monitoring-no Compliant with medications-yes without side effects ROS-Denies any CP, HA, SOB, blurry vision, LE edema, transient weakness, orthopnea, PND.    ROS: As per HPI.  Past Medical History - Reviewed and updated Patient Active Problem List   Diagnosis Date Noted  . Insomnia 05/19/2014  . Sore throat 03/15/2014  . HTN (hypertension) 03/15/2014  . Healthcare maintenance 03/15/2014  . Arthritis of left hip 11/18/2013  . Status post total replacement of left hip 11/18/2013    Medications- reviewed and updated Current Outpatient Prescriptions  Medication Sig Dispense Refill  . amLODipine-benazepril (LOTREL) 5-20 MG per capsule Take 1 capsule by mouth every morning.    Marland Kitchen MOVIPREP 100 G SOLR Take 1 kit (200 g total) by mouth once. moviprep as directed. No substitutions 1 kit 0  . Multiple Vitamin (MULTIVITAMIN WITH MINERALS) TABS tablet Take 1 tablet by mouth daily.    Marland Kitchen oxyCODONE-acetaminophen (ROXICET) 5-325 MG per tablet Take 1-2 tablets by mouth every 4 (four) hours as needed for severe pain. (Patient not taking: Reported on 03/15/2014) 60 tablet 0  . traMADol (ULTRAM) 50 MG  tablet Take 100 mg by mouth every 12 (twelve) hours as needed.     No current facility-administered medications for this visit.    Objective: Physical Exam: BP 158/91 mmHg  Pulse 62  Temp(Src) 98.6 F (37 C) (Oral)  Ht 5' 4"  (1.626 m)  Wt 151 lb (68.493 kg)  BMI 25.91 kg/m2  Gen: NAD, resting comfortably CV: RRR with no murmurs appreciated Lungs: NWOB, CTAB with no crackles, wheezes, or rhonchi Abdomen: Normal bowel sounds present. Soft, Nontender, Nondistended. Ext: no edema Skin: warm, dry Neuro: grossly normal, moves all extremities   A/P: See problem list  Insomnia Patient's fatigue symptoms are likely related to his poor quality of sleep secondary to pain in his right hip. Recent labs, including CBC and TSH all within normal limits. Discussed good sleep hygiene practices, though suspect that his sleep will improve once his pain is better controlled. If symptoms not improving with better sleep, can consider further work up including HCV, HIV, testosterone levels, etc.   Patient will call orthopedist to set appointment for evaluation for hip replacement.    Healthcare maintenance Colonoscopy scheduled for next month, though patient reported that his copay was $200. Encouraged patient to follow through. Also gave option of hemoccult cards. He recently changed insurance and will call to see if his copay is different.    HTN (hypertension) Slightly elevated today, though possible exacerbated by pain. Will continue amlodipine-benazepril for now. Can consider additional agent if still elevated at  next visit.      Algis Greenhouse. Jerline Pain, [place] Resident PGY-1 05/19/2014 1:16 PM

## 2014-05-19 NOTE — Assessment & Plan Note (Signed)
Slightly elevated today, though possible exacerbated by pain. Will continue amlodipine-benazepril for now. Can consider additional agent if still elevated at next visit.

## 2014-05-19 NOTE — Assessment & Plan Note (Addendum)
Patient's fatigue symptoms are likely related to his poor quality of sleep secondary to pain in his right hip. Recent labs, including CBC and TSH all within normal limits. Discussed good sleep hygiene practices, though suspect that his sleep will improve once his pain is better controlled. If symptoms not improving with better sleep, can consider further work up including HCV, HIV, testosterone levels, etc.   Patient will call orthopedist to set appointment for evaluation for hip replacement.

## 2014-06-12 ENCOUNTER — Encounter: Payer: Commercial Managed Care - HMO | Admitting: Gastroenterology

## 2014-06-12 ENCOUNTER — Telehealth: Payer: Self-pay | Admitting: Gastroenterology

## 2014-06-12 NOTE — Telephone Encounter (Signed)
Yes, charge. 

## 2014-07-06 ENCOUNTER — Other Ambulatory Visit: Payer: Self-pay | Admitting: *Deleted

## 2014-07-06 MED ORDER — AMLODIPINE BESY-BENAZEPRIL HCL 5-20 MG PO CAPS: 1.0000 | ORAL_CAPSULE | Freq: Every morning | ORAL | Status: DC

## 2014-07-17 ENCOUNTER — Telehealth: Payer: Self-pay | Admitting: Family Medicine

## 2014-07-17 DIAGNOSIS — M25551 Pain in right hip: Secondary | ICD-10-CM | POA: Diagnosis not present

## 2014-07-17 DIAGNOSIS — M25552 Pain in left hip: Secondary | ICD-10-CM | POA: Diagnosis not present

## 2014-07-17 NOTE — Telephone Encounter (Signed)
[facility] pharmacy says if the 2 ingredients in amlopine can be separted into 2 pills, they will send it for free. If it remains the same, they will be charged for 2 RXs.   Pt is trying to go thru mail order now Please advise

## 2014-07-18 MED ORDER — BENAZEPRIL HCL 20 MG PO TABS: 20.0000 mg | ORAL_TABLET | Freq: Every day | ORAL | Status: DC

## 2014-07-18 MED ORDER — AMLODIPINE BESYLATE 5 MG PO TABS: 5.0000 mg | ORAL_TABLET | Freq: Every day | ORAL | Status: DC

## 2014-07-18 NOTE — Telephone Encounter (Signed)
Pt is aware of this. Victor Santiago,CMA  

## 2014-07-18 NOTE — Telephone Encounter (Signed)
Will forward to PCP for review. Deundra Bard, CMA. 

## 2014-07-18 NOTE — Telephone Encounter (Signed)
Rx for separate amlodipine and benazepril sent in. Will instruct patient to STOP taking combo pill.  Katina Degreealeb M. Jimmey RalphParker, MD [facility] Resident PGY-1 07/18/2014 3:36 PM

## 2014-07-19 DIAGNOSIS — Z96642 Presence of left artificial hip joint: Secondary | ICD-10-CM | POA: Diagnosis not present

## 2014-07-19 DIAGNOSIS — M1611 Unilateral primary osteoarthritis, right hip: Secondary | ICD-10-CM | POA: Diagnosis not present

## 2014-07-19 DIAGNOSIS — Z6823 Body mass index (BMI) 23.0-23.9, adult: Secondary | ICD-10-CM | POA: Diagnosis not present

## 2014-07-19 DIAGNOSIS — I10 Essential (primary) hypertension: Secondary | ICD-10-CM | POA: Diagnosis not present

## 2014-07-20 ENCOUNTER — Ambulatory Visit (AMBULATORY_SURGERY_CENTER): Payer: Self-pay

## 2014-07-20 VITALS — Ht 64.0 in | Wt 143.2 lb

## 2014-07-20 DIAGNOSIS — Z1211 Encounter for screening for malignant neoplasm of colon: Secondary | ICD-10-CM

## 2014-07-20 NOTE — Progress Notes (Signed)
Per pt, no allergies to soy or egg products.Pt not taking any weight loss meds or using  O2 at home. 

## 2014-07-24 DIAGNOSIS — M25551 Pain in right hip: Secondary | ICD-10-CM | POA: Diagnosis not present

## 2014-07-27 ENCOUNTER — Telehealth: Payer: Self-pay | Admitting: Gastroenterology

## 2014-07-27 ENCOUNTER — Encounter: Payer: Commercial Managed Care - HMO | Admitting: Gastroenterology

## 2014-07-27 NOTE — Telephone Encounter (Signed)
Yes, charge. 

## 2014-07-27 NOTE — Telephone Encounter (Signed)
This patient was scheduled for 2 pm today. He stated that he "didn't take his prep." First he said that he forgot to take it last night, and then he said that he "couldn't afford it."  He has been eating and not on clear liquids.  Just called this AM with information.  Told him that Dr. Russella DarStark would probably charge him for canceling so late.  Patient stated that it was his "wife's fault." Canceled procedure. Patient may or may not reschedule. I told him that we could help with costs of the prep (had miralax) , but he would still have to follow the eating directions. There was no comment.

## 2014-08-25 ENCOUNTER — Encounter: Payer: Self-pay | Admitting: Family Medicine

## 2014-08-25 ENCOUNTER — Ambulatory Visit (INDEPENDENT_AMBULATORY_CARE_PROVIDER_SITE_OTHER): Payer: Commercial Managed Care - HMO | Admitting: Family Medicine

## 2014-08-25 VITALS — BP 138/86 | HR 72 | Temp 98.4°F | Ht 64.0 in | Wt 142.0 lb

## 2014-08-25 DIAGNOSIS — J069 Acute upper respiratory infection, unspecified: Secondary | ICD-10-CM

## 2014-08-25 MED ORDER — BENZONATATE 200 MG PO CAPS: 200.0000 mg | ORAL_CAPSULE | Freq: Three times a day (TID) | ORAL | Status: DC | PRN

## 2014-08-25 MED ORDER — MOMETASONE FUROATE 50 MCG/ACT NA SUSP: 4.0000 | Freq: Every day | NASAL | Status: DC

## 2014-08-25 NOTE — Patient Instructions (Addendum)
Thank you for coming to the clinic today. It was nice seeing you.  For your cough, we will prescribe you a nasal spray and tessalon. You should spray 2 sprays into each nostril every day.   If your cough is not improving in 1-2 weeks, or you start to develop shortness of breath or sever fevers, please come back sooner.  We will see you again in 3 months or sooner if you need anything else.

## 2014-08-25 NOTE — Progress Notes (Signed)
Victor Santiago is a 59 y.o. male who presents to the [facility] today with a chief complaint of sore throat. His concerns today include:  HPI:  Cough. Patient has persistent cough that started about a week ago. Cough is nonproductive. Also Describes a "tickling" like sensation in his throat. Says that this happens every year around this year. No rhinorrhea. No nasal congestion. No wheezes. No fevers or chills. No ear pain. Granddaughter had a recent viral illness.    ROS: As per HPI, otherwise all systems reviewed and are negative.  Past Medical History - Reviewed and updated Patient Active Problem List   Diagnosis Date Noted  . Insomnia 05/19/2014  . URI (upper respiratory infection) 03/15/2014  . HTN (hypertension) 03/15/2014  . Healthcare maintenance 03/15/2014  . Arthritis of left hip 11/18/2013  . Status post total replacement of left hip 11/18/2013    Medications- reviewed and updated Current Outpatient Prescriptions  Medication Sig Dispense Refill  . amLODipine (NORVASC) 5 MG tablet Take 1 tablet (5 mg total) by mouth daily. (Patient not taking: Reported on 07/20/2014) 90 tablet 3  . benazepril (LOTENSIN) 20 MG tablet Take 1 tablet (20 mg total) by mouth daily. 90 tablet 3  . benzonatate (TESSALON) 200 MG capsule Take 1 capsule (200 mg total) by mouth 3 (three) times daily as needed for cough. 20 capsule 0  . bisacodyl (DULCOLAX) 5 MG EC tablet Take 5 mg by mouth. Dulcolax  5 mg bowel prep #4-Take as directed    . mometasone (NASONEX) 50 MCG/ACT nasal spray Place 4 sprays into the nose daily. 17 g 12  . MOVIPREP 100 G SOLR Take 1 kit (200 g total) by mouth once. moviprep as directed. No substitutions (Patient not taking: Reported on 07/20/2014) 1 kit 0  . Multiple Vitamin (MULTIVITAMIN WITH MINERALS) TABS tablet Take 1 tablet by mouth daily.    Marland Kitchen oxyCODONE-acetaminophen (ROXICET) 5-325 MG per tablet Take 1-2 tablets by mouth every 4 (four) hours as needed for severe pain. (Patient  not taking: Reported on 03/15/2014) 60 tablet 0  . polyethylene glycol powder (GLYCOLAX/MIRALAX) powder Take 1 Container by mouth once. Miralax bowel prep 238gms-Take as directed    . traMADol (ULTRAM) 50 MG tablet Take 100 mg by mouth every 12 (twelve) hours as needed.     No current facility-administered medications for this visit.    Objective: Physical Exam: BP 138/86 mmHg  Pulse 72  Temp(Src) 98.4 F (36.9 C) (Oral)  Ht _0  (1.626 m)  Wt 142 lb (64.411 kg)  BMI 24.36 kg/m2  SpO2 98%  Gen: NAD, resting comfortably CV: RRR with no murmurs appreciated HEENT: TM clear bilaterally, oropharynx erythematous with no other lesions, nasal turbinate erythematous Lungs: NWOB, CTAB with no crackles, wheezes, or rhonchi Abdomen: Normal bowel sounds present. Soft, Nontender, Nondistended. Ext: no edema Skin: warm, dry Neuro: grossly normal, moves all extremities A/P: See problem list  URI (upper respiratory infection) No SOB, fevers, chills, or concerning exam findings. Will treat symptomatically with Nasonex and tessalon perles. Return precautions given.     Meds ordered this encounter  Medications  . DISCONTD: mometasone (NASONEX) 50 MCG/ACT nasal spray    Sig: Place 4 sprays into the nose daily.    Dispense:  17 g    Refill:  12  . DISCONTD: benzonatate (TESSALON) 200 MG capsule    Sig: Take 1 capsule (200 mg total) by mouth 3 (three) times daily as needed for cough.    Dispense:  20 capsule    Refill:  0  . mometasone (NASONEX) 50 MCG/ACT nasal spray    Sig: Place 4 sprays into the nose daily.    Dispense:  17 g    Refill:  12  . benzonatate (TESSALON) 200 MG capsule    Sig: Take 1 capsule (200 mg total) by mouth 3 (three) times daily as needed for cough.    Dispense:  20 capsule    Refill:  0     Walburga Hudman M. Jerline Pain, [place] Resident PGY-1 08/25/2014 5:30 PM

## 2014-08-25 NOTE — Assessment & Plan Note (Signed)
No SOB, fevers, chills, or concerning exam findings. Will treat symptomatically with Nasonex and tessalon perles. Return precautions given.

## 2014-11-28 DIAGNOSIS — M25551 Pain in right hip: Secondary | ICD-10-CM | POA: Diagnosis not present

## 2014-11-28 DIAGNOSIS — M25552 Pain in left hip: Secondary | ICD-10-CM | POA: Diagnosis not present

## 2015-02-16 ENCOUNTER — Other Ambulatory Visit: Payer: Self-pay | Admitting: *Deleted

## 2015-02-20 MED ORDER — TRAMADOL HCL 50 MG PO TABS: 100.0000 mg | ORAL_TABLET | Freq: Two times a day (BID) | ORAL | Status: DC | PRN

## 2015-02-20 NOTE — Telephone Encounter (Signed)
Rx filled and left at front of office.  Katina Degreealeb M. Jimmey RalphParker, MD [facility] Resident PGY-2 02/20/2015 9:30 AM

## 2015-02-20 NOTE — Telephone Encounter (Signed)
Patient is aware of this. Jazmin Hartsell,CMA  

## 2015-03-29 ENCOUNTER — Other Ambulatory Visit: Payer: Self-pay | Admitting: Family Medicine

## 2015-03-29 MED ORDER — TRAMADOL HCL 50 MG PO TABS: 100.0000 mg | ORAL_TABLET | Freq: Two times a day (BID) | ORAL | Status: DC | PRN

## 2015-03-29 NOTE — Telephone Encounter (Signed)
Refill request for Tramadol. Pt completely out.

## 2015-03-29 NOTE — Telephone Encounter (Signed)
Tramadol Rx for 1 month supply at the front desk; please have the patient make an appt with Dr. Jimmey RalphParker.  Thanks, Joanna Puffrystal S. Richanda Darin, MD [facility] Resident  03/29/2015, 5:35 PM

## 2015-03-30 NOTE — Telephone Encounter (Signed)
Patient informed. 

## 2015-04-13 ENCOUNTER — Ambulatory Visit: Payer: Self-pay | Admitting: Family Medicine

## 2015-04-23 ENCOUNTER — Encounter: Payer: Self-pay | Admitting: Family Medicine

## 2015-04-23 ENCOUNTER — Ambulatory Visit (INDEPENDENT_AMBULATORY_CARE_PROVIDER_SITE_OTHER): Payer: Commercial Managed Care - HMO | Admitting: Family Medicine

## 2015-04-23 VITALS — BP 128/91 | HR 67 | Temp 98.1°F | Ht 64.0 in | Wt 148.8 lb

## 2015-04-23 DIAGNOSIS — M1990 Unspecified osteoarthritis, unspecified site: Secondary | ICD-10-CM

## 2015-04-23 DIAGNOSIS — Z79899 Other long term (current) drug therapy: Secondary | ICD-10-CM

## 2015-04-23 DIAGNOSIS — Z0000 Encounter for general adult medical examination without abnormal findings: Secondary | ICD-10-CM | POA: Diagnosis not present

## 2015-04-23 DIAGNOSIS — I10 Essential (primary) hypertension: Secondary | ICD-10-CM | POA: Diagnosis not present

## 2015-04-23 DIAGNOSIS — M1611 Unilateral primary osteoarthritis, right hip: Secondary | ICD-10-CM | POA: Insufficient documentation

## 2015-04-23 LAB — CBC
HCT: 42.8 % (ref 39.0–52.0)
HEMOGLOBIN: 14.2 g/dL (ref 13.0–17.0)
MCH: 30.7 pg (ref 26.0–34.0)
MCHC: 33.2 g/dL (ref 30.0–36.0)
MCV: 92.4 fL (ref 78.0–100.0)
MPV: 10.9 fL (ref 8.6–12.4)
Platelets: 266 10*3/uL (ref 150–400)
RBC: 4.63 MIL/uL (ref 4.22–5.81)
RDW: 14.2 % (ref 11.5–15.5)
WBC: 9.6 10*3/uL (ref 4.0–10.5)

## 2015-04-23 LAB — POCT GLYCOSYLATED HEMOGLOBIN (HGB A1C): HEMOGLOBIN A1C: 5.5

## 2015-04-23 MED ORDER — MELOXICAM 7.5 MG PO TABS: 7.5000 mg | ORAL_TABLET | Freq: Every day | ORAL | Status: DC

## 2015-04-23 NOTE — Patient Instructions (Signed)
We will send in a referral to Dr Magnus IvanBlackman for your right hip. We will also start meloxicam. Please take 1 pill per day. Do not take ibuprofen or alleve with this medication.  We will check your blood work today. You will get a letter in the mail with the results.  Please come back in 3-6 months, or sooner if you need anything else.  Take care,  Dr Jimmey RalphParker

## 2015-04-23 NOTE — Assessment & Plan Note (Signed)
Well controlled today. Continue current medications 

## 2015-04-23 NOTE — Progress Notes (Signed)
    Subjective:  Victor Santiago is a 60 y.o. male who [facility] with a chief complaint of right hip pain.   HPI:  Hip Pain Patient has a history of bilateral hip arthritis. He has previously seen Dr Magnus IvanBlackman and had his left left hip replaced last year. Patient is no experiencing increased pain in his right hip and requests another referral for possibly replacement. Pain is constant and worse with movement and positioning. He has tried tramadol which only makes him sleepy. Has tried meloxicam in the past which has worked well. No weakness or numbness in his leg. No fevers or chills. No swelling.   Hypertension BP Readings from Last 3 Encounters:  04/23/15 128/91  08/25/14 138/86  05/19/14 158/91   Home BP monitoring-Yes Compliant with medications-yes, without side effects ROS-Denies any CP, HA, SOB, blurry vision, LE edema, transient weakness, orthopnea, PND.   Healthcare Maintenance Due for colonoscopy but patient declined. Would like PSA level checked. Would also like vitamin D and B12 levels checked.  ROS: Per HPI  PMH:  The following were reviewed and entered/updated in epic: Past Medical History  Diagnosis Date  . Hypertension   . Arthritis     hip/pt uses cane  . Allergy     seasonal/ hay fever   Patient Active Problem List   Diagnosis Date Noted  . Arthritis of right hip 04/23/2015  . Insomnia 05/19/2014  . HTN (hypertension) 03/15/2014  . Healthcare maintenance 03/15/2014  . Arthritis of left hip 11/18/2013  . Status post total replacement of left hip 11/18/2013   Past Surgical History  Procedure Laterality Date  . Tonsillectomy      as child  . Total hip arthroplasty Left 11/18/2013    Procedure: LEFT TOTAL HIP ARTHROPLASTY ANTERIOR APPROACH;  Surgeon: Kathryne Hitchhristopher Y Blackman, MD;  Location: [facility];  Service: Orthopedics;  Laterality: Left;     Objective:  Physical Exam: BP 128/91 mmHg  Pulse 67  Temp(Src) 98.1 F (36.7 C) (Oral)  Ht  5\' 4"  (1.626 m)  Wt 148 lb 12.8 oz (67.495 kg)  BMI 25.53 kg/m2  Gen: NAD, resting comfortably CV: RRR with no murmurs appreciated Pulm: NWOB, CTAB with no crackles, wheezes, or rhonchi MSK:  -Left hip: limited internal and external rotation. No pain -Right hip: No deformities. Nontender to palpation. Approximately 30 degrees of external rotation and 15 degrees of internal rotation. Pain with passive movement.  Skin: warm, dry Neuro: grossly normal, moves all extremities Psych: Normal affect and thought content  Assessment/Plan:  Arthritis of right hip Referral to orthopedics placed. Will start daily meloxicam.  HTN (hypertension) Well controlled today. Continue current medications.   Healthcare maintenance Colonoscopy deferred again. Will check CBC, BMP, A1c, lipid panel, HIV antibody, HCV antibody. Will also check PSA and vitamin D and B12 per patient request.    Katina Degreealeb M. Jimmey RalphParker, MD [facility] Resident PGY-2 04/23/2015 4:54 PM

## 2015-04-23 NOTE — Assessment & Plan Note (Signed)
Colonoscopy deferred again. Will check CBC, BMP, A1c, lipid panel, HIV antibody, HCV antibody. Will also check PSA and vitamin D and B12 per patient request.

## 2015-04-23 NOTE — Assessment & Plan Note (Signed)
Referral to orthopedics placed. Will start daily meloxicam.

## 2015-04-24 ENCOUNTER — Encounter: Payer: Self-pay | Admitting: Family Medicine

## 2015-04-24 LAB — VITAMIN D 25 HYDROXY (VIT D DEFICIENCY, FRACTURES): Vit D, 25-Hydroxy: 34 ng/mL (ref 30–100)

## 2015-04-24 LAB — LIPID PANEL
CHOL/HDL RATIO: 3.2 ratio (ref <=5.0)
Cholesterol: 213 mg/dL — ABNORMAL HIGH (ref 125–200)
HDL: 66 mg/dL (ref >=40)
LDL CALC: 112 mg/dL (ref <130)
TRIGLYCERIDES: 173 mg/dL — AB (ref <150)
VLDL: 35 mg/dL — ABNORMAL HIGH (ref <30)

## 2015-04-24 LAB — BASIC METABOLIC PANEL WITH GFR
BUN: 17 mg/dL (ref 7–25)
CO2: 28 mmol/L (ref 20–31)
Calcium: 9.5 mg/dL (ref 8.6–10.3)
Chloride: 102 mmol/L (ref 98–110)
Creat: 1.08 mg/dL (ref 0.70–1.33)
GFR, EST NON AFRICAN AMERICAN: 75 mL/min (ref >=60)
GFR, Est African American: 87 mL/min (ref >=60)
Glucose, Bld: 83 mg/dL (ref 65–99)
POTASSIUM: 4.1 mmol/L (ref 3.5–5.3)
SODIUM: 137 mmol/L (ref 135–146)

## 2015-04-24 LAB — HEPATITIS C ANTIBODY: HCV AB: NEGATIVE

## 2015-04-24 LAB — PSA: PSA: 1.16 ng/mL (ref <=4.00)

## 2015-04-24 LAB — HIV ANTIBODY (ROUTINE TESTING W REFLEX): HIV 1&2 Ab, 4th Generation: NONREACTIVE

## 2015-04-24 LAB — VITAMIN B12: Vitamin B-12: 1059 pg/mL — ABNORMAL HIGH (ref 211–911)

## 2015-06-12 ENCOUNTER — Other Ambulatory Visit: Payer: Self-pay | Admitting: Family Medicine

## 2015-06-12 MED ORDER — MELOXICAM 7.5 MG PO TABS: 7.5000 mg | ORAL_TABLET | Freq: Every day | ORAL | Status: DC

## 2015-06-12 NOTE — Telephone Encounter (Signed)
Patient is out of medications and patient is in pain.  Clovis PuMartin, Keonte Daubenspeck L, RN

## 2015-06-12 NOTE — Telephone Encounter (Signed)
Tramadol 50 mg #30, 0 refills called in to [facility]. Patient is aware.  Clovis PuMartin, Tamika L, RN

## 2015-06-12 NOTE — Telephone Encounter (Signed)
Please call and authorize Tramadol 50mg  #30 with no refills.

## 2015-06-22 DIAGNOSIS — H52203 Unspecified astigmatism, bilateral: Secondary | ICD-10-CM | POA: Diagnosis not present

## 2015-06-22 DIAGNOSIS — Z0100 Encounter for examination of eyes and vision without abnormal findings: Secondary | ICD-10-CM | POA: Diagnosis not present

## 2015-06-22 DIAGNOSIS — H5210 Myopia, unspecified eye: Secondary | ICD-10-CM | POA: Diagnosis not present

## 2015-06-29 ENCOUNTER — Other Ambulatory Visit: Payer: Self-pay | Admitting: Family Medicine

## 2015-06-29 NOTE — Telephone Encounter (Signed)
LM for patient that script is ready for pick up. Jazmin Hartsell,CMA  

## 2015-06-29 NOTE — Telephone Encounter (Signed)
Needs refills on his tramadol.  Please call when Rx is ready for pickup

## 2015-06-29 NOTE — Telephone Encounter (Signed)
Rx filled and left at front of office.  Katina Degreealeb M. Jimmey RalphParker, MD [facility] Resident PGY-2 06/29/2015 4:35 PM

## 2015-07-11 ENCOUNTER — Other Ambulatory Visit: Payer: Self-pay | Admitting: Family Medicine

## 2015-07-11 NOTE — Telephone Encounter (Signed)
Rx filled.  Victor Degreealeb M. Jimmey RalphParker, MD [facility] Resident PGY-2 07/11/2015 12:46 PM

## 2015-08-06 ENCOUNTER — Ambulatory Visit: Payer: Self-pay | Admitting: Family Medicine

## 2015-08-16 ENCOUNTER — Other Ambulatory Visit: Payer: Self-pay | Admitting: Family Medicine

## 2015-08-16 NOTE — Telephone Encounter (Signed)
Rx filled and left at front of office.  Katina Degreealeb M. Jimmey RalphParker, MD [facility] Resident PGY-2 08/16/2015 5:14 PM

## 2015-08-17 NOTE — Telephone Encounter (Signed)
Informed pt of rx refill at the front.

## 2015-08-17 NOTE — Telephone Encounter (Signed)
Rx was already filled and left at front of office.  Katina Degreealeb M. Jimmey RalphParker, MD [facility] Resident PGY-2 08/17/2015 1:42 PM

## 2015-09-22 ENCOUNTER — Other Ambulatory Visit: Payer: Self-pay | Admitting: Family Medicine

## 2015-09-24 NOTE — Telephone Encounter (Signed)
Patient is aware. Cleona Doubleday,CMA  

## 2015-09-24 NOTE — Telephone Encounter (Signed)
Rx filled and left at front of office.  Victor Degreealeb M. Jimmey RalphParker, MD [facility] Resident PGY-2 09/24/2015 11:23 AM

## 2015-11-10 ENCOUNTER — Other Ambulatory Visit: Payer: Self-pay | Admitting: Family Medicine

## 2016-01-24 IMAGING — CR DG CHEST 2V
2 series · 2 of 2 positions shown · non-contrast
Comparison: None.

CLINICAL DATA: Preoperative for hip replacement surgery.

EXAM:
CHEST  2 VIEW

[w chest pa]
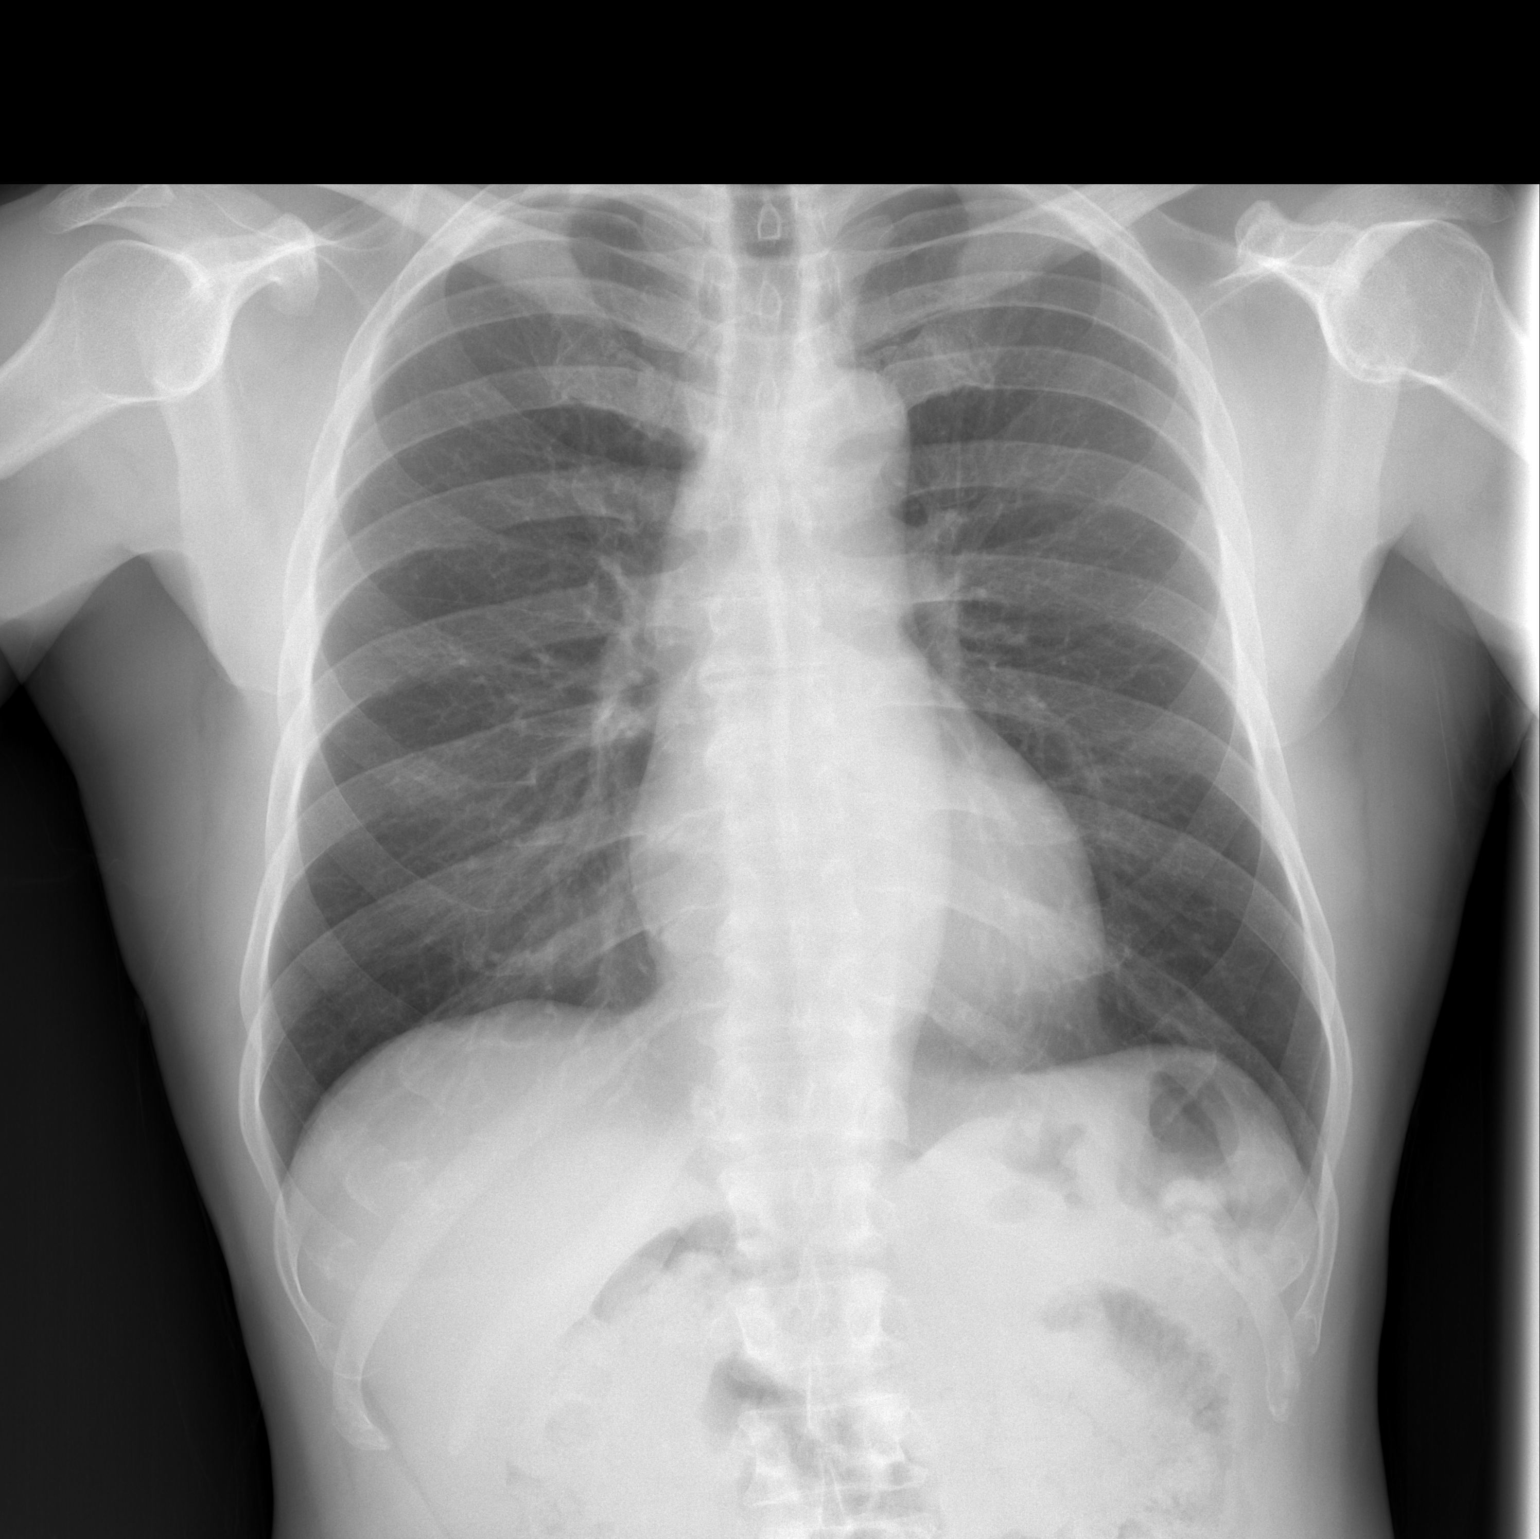

[w chest lat]
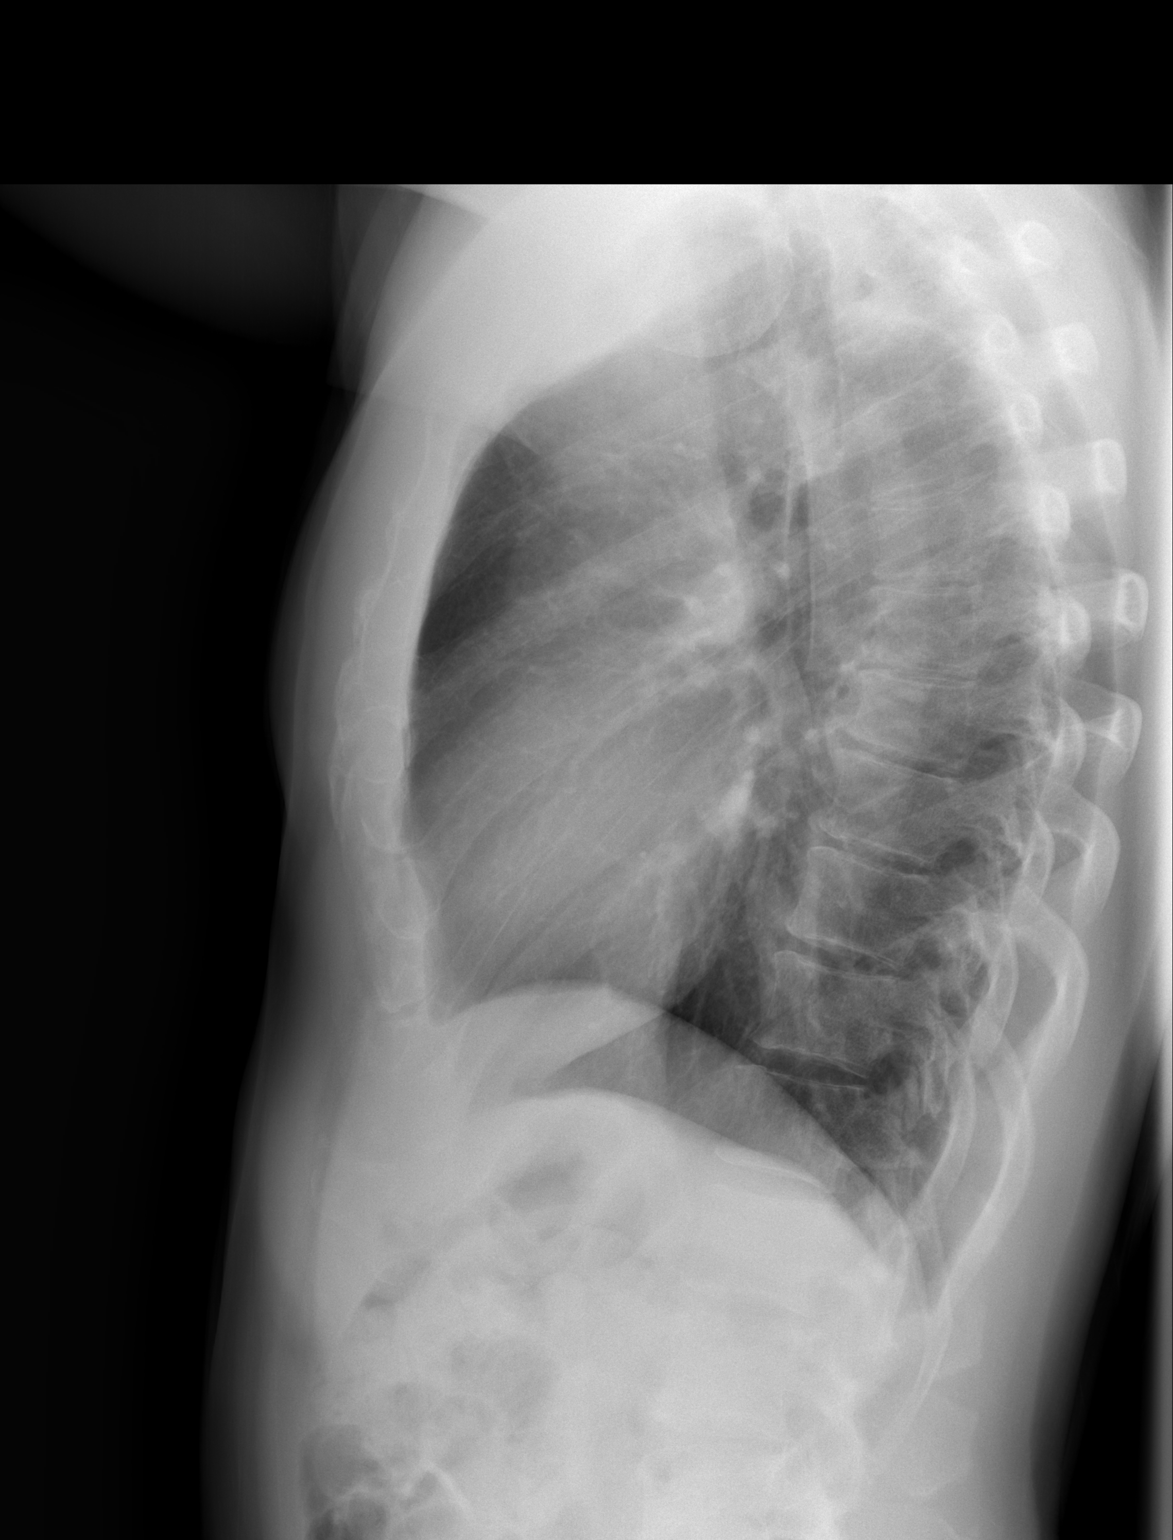

[2 of 2 positions shown; findings below may reference images not displayed]

FINDINGS: The heart size and mediastinal contours are within normal limits.
Both lungs are clear. The visualized skeletal structures are
unremarkable.
IMPRESSION: No active cardiopulmonary disease.

## 2016-04-19 IMAGING — DX DG PORTABLE PELVIS
1 series · 1 of 1 positions shown · non-contrast
Comparison: None.

CLINICAL DATA: Left hip replacement

EXAM:
PORTABLE PELVIS 1-2 VIEWS

[pelvis ap]
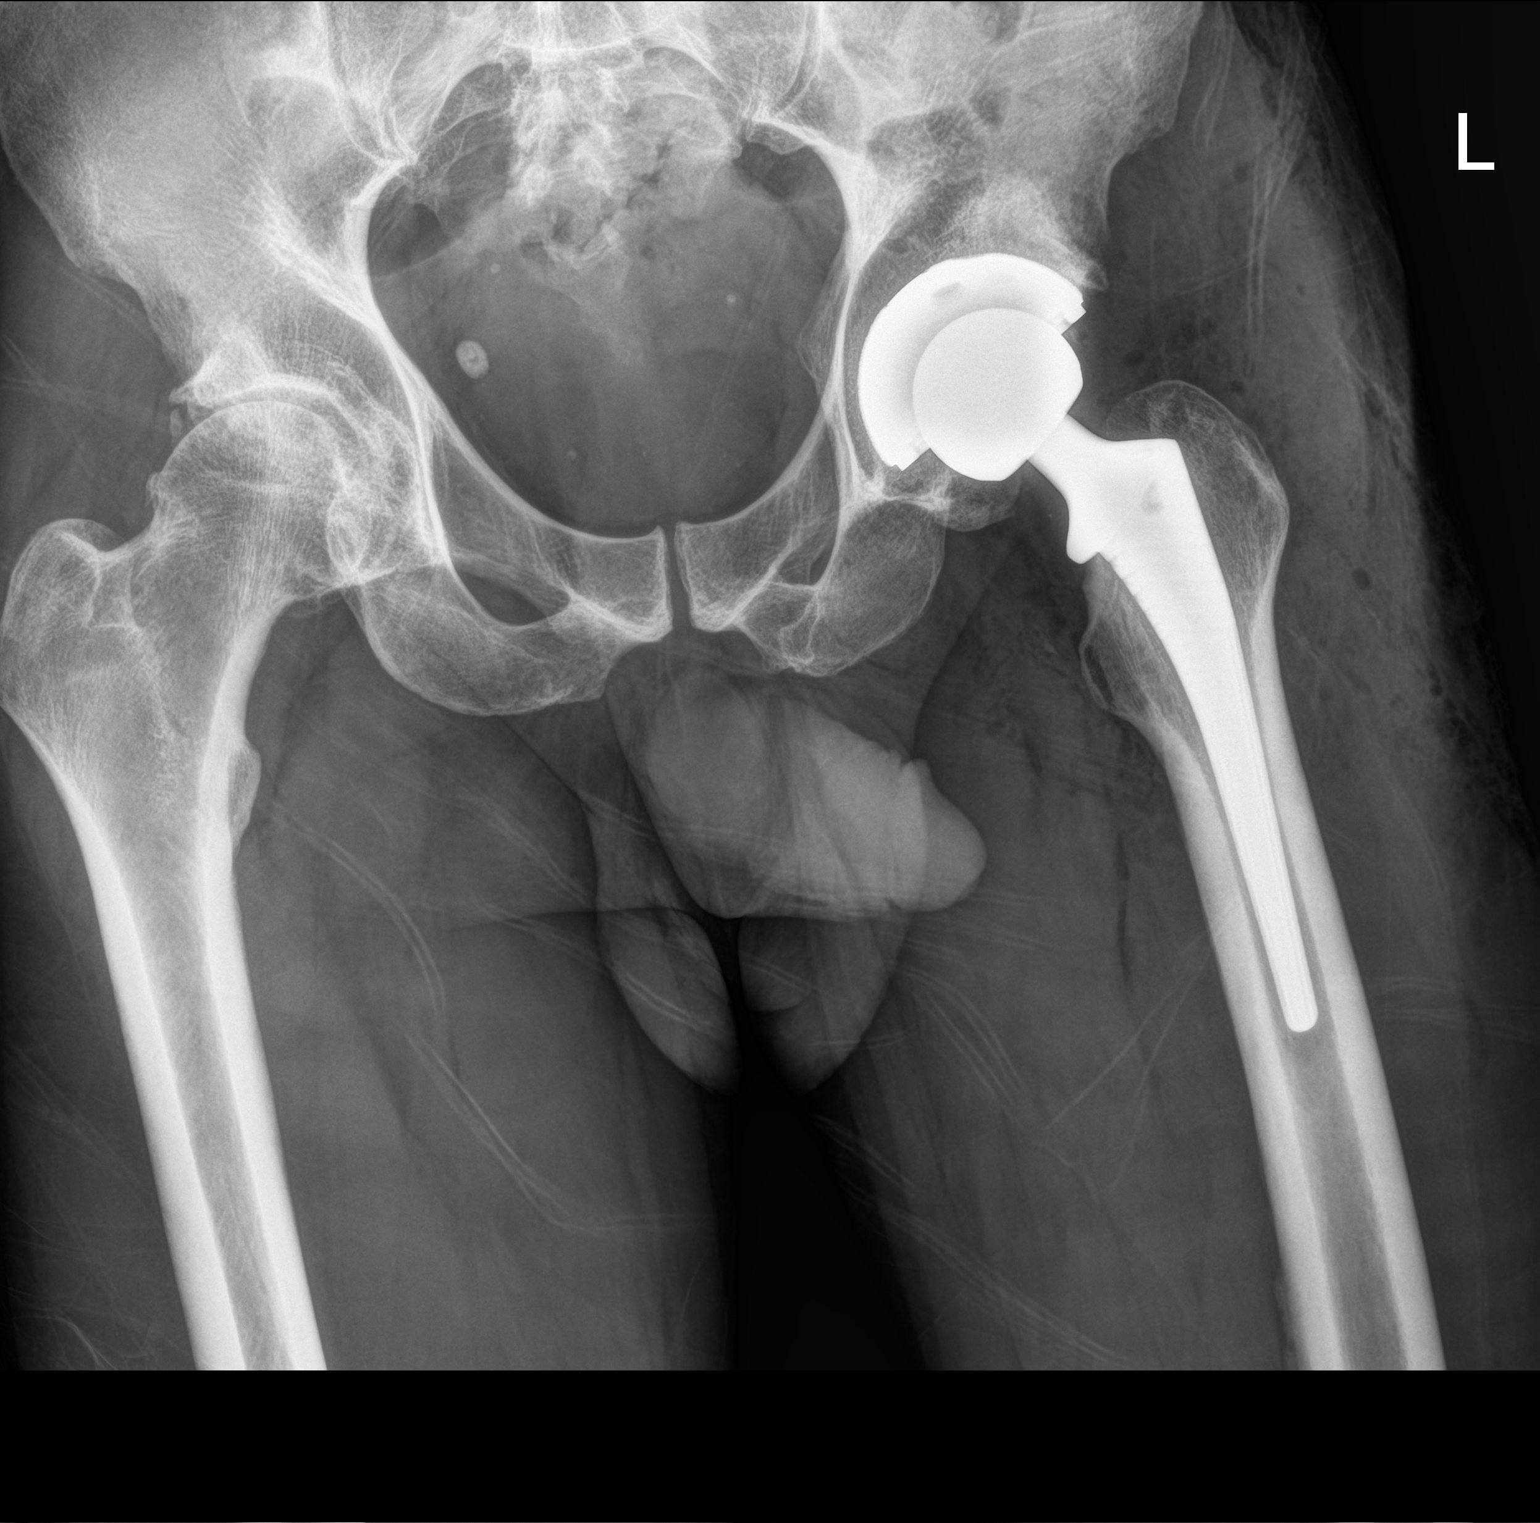

[1 of 1 positions shown; findings below may reference images not displayed]

FINDINGS: Total hip replacement has been performed on the left. Components
appear well positioned. No radiographically detectable complication.
IMPRESSION: Good appearance following left THR.

## 2016-04-19 IMAGING — RF DG HIP (WITH OR WITHOUT PELVIS) 2-3V*L*
1 series · 2 of 2 positions shown · non-contrast
Comparison: 08/13/2009

CLINICAL DATA: Left hip arthroplasty

EXAM:
LEFT HIP - COMPLETE 2+ VIEW; DG C-ARM 1-60 MIN - NRPT [facility]

[Series 1: run · 2 of 2 slices shown]
[im 1/2]
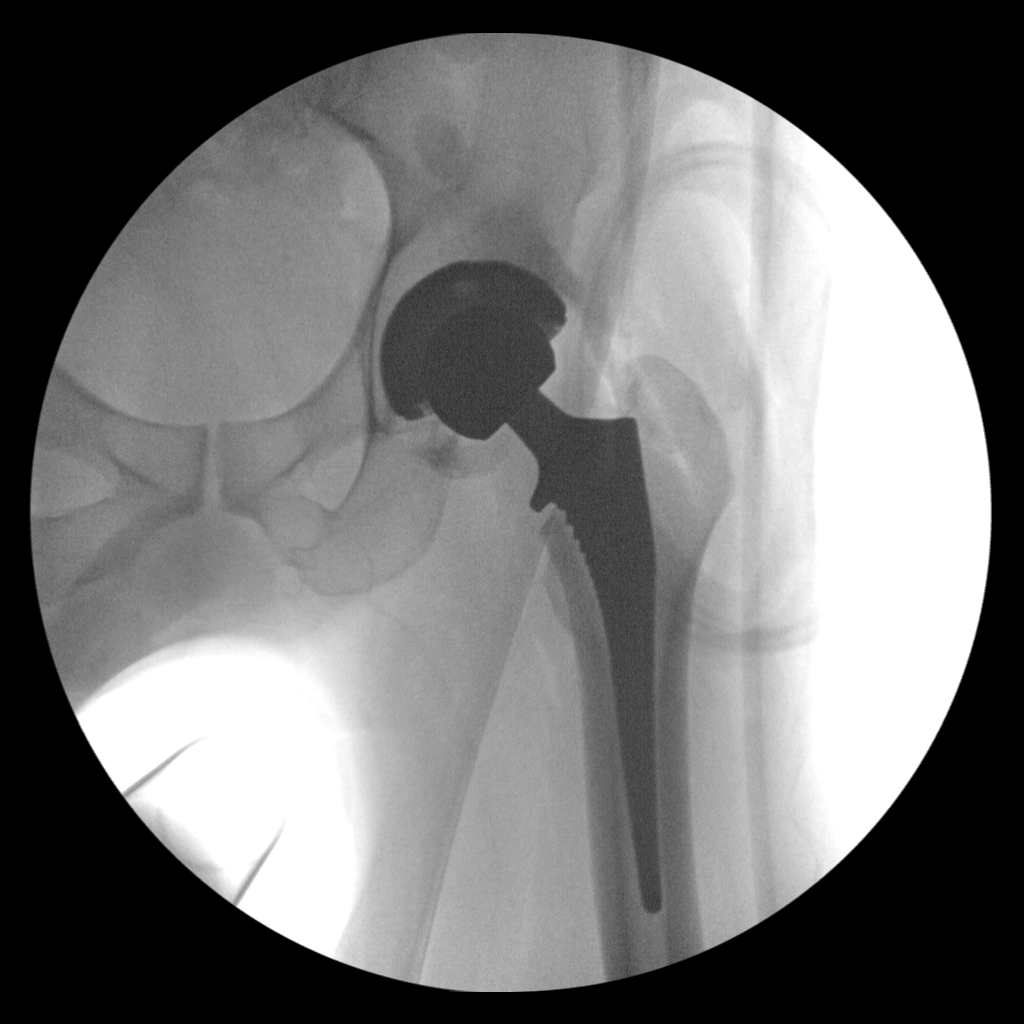
[im 2/2]
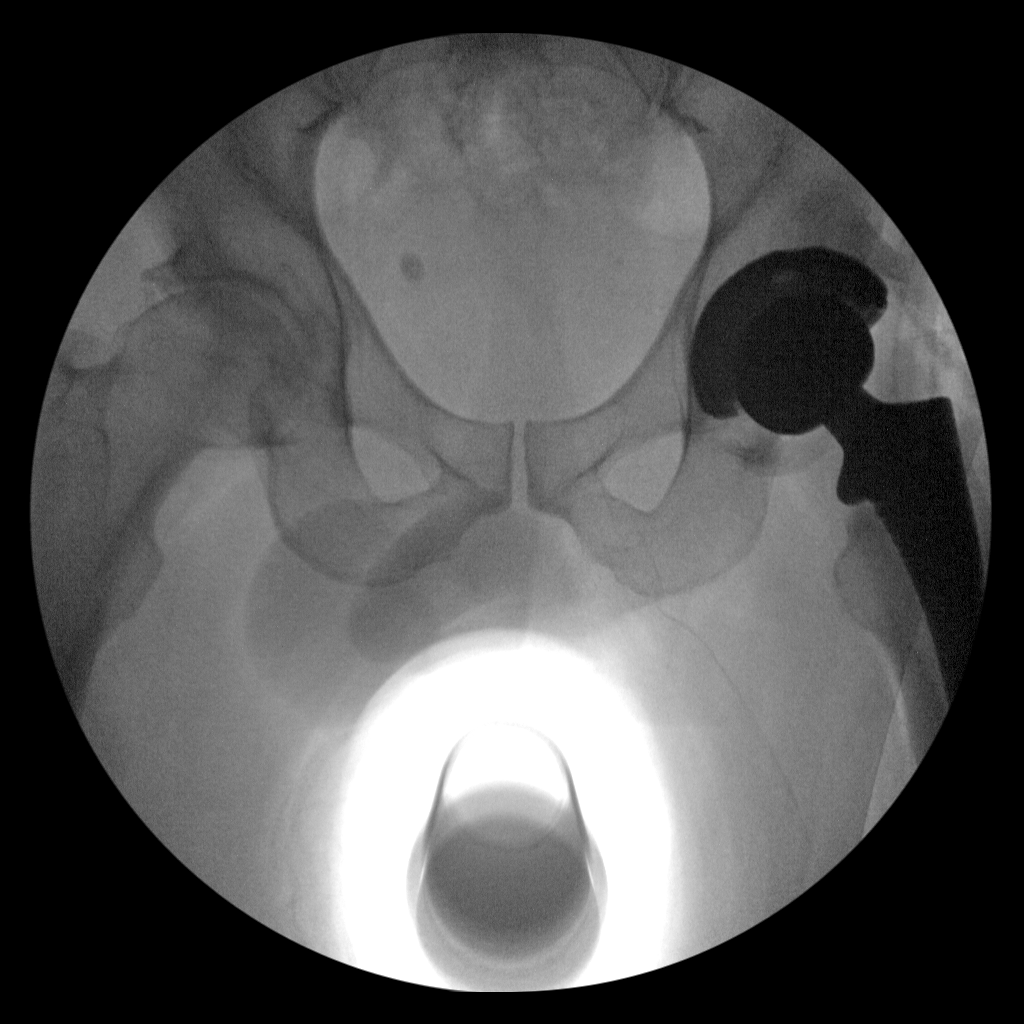

[2 of 2 positions shown; findings below may reference images not displayed]

FINDINGS: Left hip prosthetic components appear well seated and aligned on the
to images submitted. There is no acute fracture or evidence of an
operative complication.
IMPRESSION: Well aligned left hip arthroplasty.

## 2017-12-02 ENCOUNTER — Encounter (INDEPENDENT_AMBULATORY_CARE_PROVIDER_SITE_OTHER): Payer: Self-pay | Admitting: Orthopaedic Surgery

## 2017-12-02 ENCOUNTER — Ambulatory Visit (INDEPENDENT_AMBULATORY_CARE_PROVIDER_SITE_OTHER): Payer: Medicare HMO | Admitting: Orthopaedic Surgery

## 2017-12-02 ENCOUNTER — Ambulatory Visit (INDEPENDENT_AMBULATORY_CARE_PROVIDER_SITE_OTHER): Payer: Medicare HMO

## 2017-12-02 DIAGNOSIS — M1611 Unilateral primary osteoarthritis, right hip: Secondary | ICD-10-CM

## 2017-12-02 DIAGNOSIS — M25551 Pain in right hip: Secondary | ICD-10-CM

## 2017-12-02 NOTE — Progress Notes (Signed)
Office Visit Note   Patient: Victor Santiago           Date of Birth: 17-Nov-1955           MRN: [id number] Visit Date: 12/02/2017              Requested by: Ellwood Dense, DO [address] [place], [place] [address] PCP: Ellwood Dense, DO   Assessment & Plan: Visit Diagnoses:  1. Pain in right hip   2. Unilateral primary osteoarthritis, right hip     Plan: Based on his clinical exam findings and his x-ray findings I agree that the next step would be a right total hip arthroplasty.  He is done very well with his left hip and would like to have his right hip replaced in December of this year.  He understands fully the risk and benefits of the surgery having had it done before.  He understands what his intraoperative and postoperative course involved.  All question concerns were answered and addressed.  We will work on having this scheduled for sometime in December.  Follow-Up Instructions: Return for 2 weeks post-op.   Orders:  Orders Placed This Encounter  Procedures  . XR HIP UNILAT W OR W/O PELVIS 1V RIGHT   No orders of the defined types were placed in this encounter.     Procedures: No procedures performed   Clinical Data: No additional findings.   Subjective: Chief Complaint  Patient presents with  . Right Hip - Pain  Patient is well-known to me.  He is very active and thin 62 year old gentleman that 4 years ago underwent a left total hip arthroplasty to treat severe arthritis of his left hip.  At that time he had significant arthritis of his right hip but not significant pain.  For last year he has been developing slowly worsening right hip pain.  He says his left hip is done very well.  At this point he will still proceed with a total hip arthroplasty sometime later this year likely in December.  He had no other acute changes in medical status and is very active.  He is work on activity modification as well as hip strengthening exercises.  HPI  Review of  Systems He is alert and oriented x3 and in no acute distress He currently denies any headache, chest pain, shortness of breath, fever, chills, nausea, vomiting. Objective: Vital Signs: There were no vitals taken for this visit.  Physical Exam Examination she is a is alert and oriented x3 and in no acute distress Ortho Exam He does walk with a limp favoring his right hip.  His left hip is normal exam.  His right hip has significant pain on internal extra rotation with significant stiffness on internal/external rotation. Specialty Comments:  No specialty comments available.  Imaging: Xr Hip Unilat W Or W/o Pelvis 1v Right  Result Date: 12/02/2017 An AP pelvis and lateral of the right hip show severe end-stage arthritis of the right hip.  There is significant and severe joint space narrowing.  There are sclerotic changes and cystic changes in the femoral head.  There is significant para-articular osteophytes.  This is worsened from films from 4 years ago.    PMFS History: Patient Active Problem List   Diagnosis Date Noted  . Arthritis of right hip 04/23/2015  . Insomnia 05/19/2014  . HTN (hypertension) 03/15/2014  . Healthcare maintenance 03/15/2014  . Arthritis of left hip 11/18/2013  . Status post total replacement of left  hip 11/18/2013   Past Medical History:  Diagnosis Date  . Allergy    seasonal/ hay fever  . Arthritis    hip/pt uses cane  . Hypertension     Family History  Problem Relation Age of Onset  . Uterine cancer Mother   . Colon cancer Neg Hx   . Rectal cancer Neg Hx   . Stomach cancer Neg Hx   . Esophageal cancer Neg Hx     Past Surgical History:  Procedure Laterality Date  . TONSILLECTOMY     as child  . TOTAL HIP ARTHROPLASTY Left 11/18/2013   Procedure: LEFT TOTAL HIP ARTHROPLASTY ANTERIOR APPROACH;  Surgeon: Kathryne Hitchhristopher Y Emika Tiano, MD;  Location: [facility];  Service: Orthopedics;  Laterality: Left;   Social History   Occupational History  . Not on  file  Tobacco Use  . Smoking status: Never Smoker  . Smokeless tobacco: Never Used  Substance and Sexual Activity  . Alcohol use: No  . Drug use: No  . Sexual activity: Not on file

## 2018-02-23 DIAGNOSIS — H9113 Presbycusis, bilateral: Secondary | ICD-10-CM | POA: Insufficient documentation

## 2018-06-24 ENCOUNTER — Emergency Department (HOSPITAL_COMMUNITY)
Admission: EM | Admit: 2018-06-24 | Discharge: 2018-06-24 | Disposition: A | Discharge status: Home / Self Care | Payer: Commercial Managed Care - HMO | Attending: Emergency Medicine | Admitting: Emergency Medicine

## 2018-06-24 ENCOUNTER — Encounter (HOSPITAL_COMMUNITY): Payer: Self-pay

## 2018-06-24 ENCOUNTER — Other Ambulatory Visit: Payer: Self-pay

## 2018-06-24 DIAGNOSIS — Z79899 Other long term (current) drug therapy: Secondary | ICD-10-CM | POA: Diagnosis not present

## 2018-06-24 DIAGNOSIS — I10 Essential (primary) hypertension: Secondary | ICD-10-CM | POA: Diagnosis not present

## 2018-06-24 DIAGNOSIS — J069 Acute upper respiratory infection, unspecified: Secondary | ICD-10-CM | POA: Insufficient documentation

## 2018-06-24 DIAGNOSIS — B9789 Other viral agents as the cause of diseases classified elsewhere: Secondary | ICD-10-CM

## 2018-06-24 DIAGNOSIS — R05 Cough: Secondary | ICD-10-CM | POA: Diagnosis present

## 2018-06-24 NOTE — ED Provider Notes (Signed)
[facility] EMERGENCY DEPARTMENT Provider Note   CSN: [id number] Arrival date & time: 06/24/18  1005    History   Chief Complaint Chief Complaint  Patient presents with  . Cough    HPI Victor Santiago is a 63 y.o. male with past medical history of hypertension, presenting to the emergency department with complaint of 2 days of dry cough.  Patient states his wife is ill with similar symptoms, therefore he decided to come here to be evaluated.  His cough is dry.  He has associated sore throat.  Denies rhinorrhea, nasal congestion, ear pain, shortness of breath, fever.  Has not treated his symptoms with any medications.  No recent out of country travel, or travel to high risk regions regarding COVID-19;  No contacts with known infected persons.     The history is provided by the patient.    Past Medical History:  Diagnosis Date  . Allergy    seasonal/ hay fever  . Arthritis    hip/pt uses cane  . Hypertension     Patient Active Problem List   Diagnosis Date Noted  . Arthritis of right hip 04/23/2015  . Insomnia 05/19/2014  . HTN (hypertension) 03/15/2014  . Healthcare maintenance 03/15/2014  . Arthritis of left hip 11/18/2013  . Status post total replacement of left hip 11/18/2013    Past Surgical History:  Procedure Laterality Date  . TONSILLECTOMY     as child  . TOTAL HIP ARTHROPLASTY Left 11/18/2013   Procedure: LEFT TOTAL HIP ARTHROPLASTY ANTERIOR APPROACH;  Surgeon: Mcarthur Rossetti, MD;  Location: [facility];  Service: Orthopedics;  Laterality: Left;        Home Medications    Prior to Admission medications   Medication Sig Start Date End Date Taking? Authorizing Provider  amLODipine (NORVASC) 5 MG tablet TAKE 1 TABLET EVERY DAY 07/11/15   Vivi Barrack, MD  benazepril (LOTENSIN) 20 MG tablet TAKE 1 TABLET EVERY DAY 07/11/15   Vivi Barrack, MD  benzonatate (TESSALON) 200 MG capsule Take 1 capsule (200 mg total) by mouth 3 (three) times  daily as needed for cough. 08/25/14   Vivi Barrack, MD  bisacodyl (DULCOLAX) 5 MG EC tablet Take 5 mg by mouth. Dulcolax  5 mg bowel prep #4-Take as directed    [provider]  meloxicam (MOBIC) 7.5 MG tablet TAKE ONE TABLET BY MOUTH DAILY 11/12/15   Gottschalk, Ashly M, DO  mometasone (NASONEX) 50 MCG/ACT nasal spray Place 4 sprays into the nose daily. 08/25/14   Vivi Barrack, MD  MOVIPREP 100 G SOLR Take 1 kit (200 g total) by mouth once. moviprep as directed. No substitutions Patient not taking: Reported on 07/20/2014 04/25/14   Ladene Artist, MD  Multiple Vitamin (MULTIVITAMIN WITH MINERALS) TABS tablet Take 1 tablet by mouth daily.    [provider]  polyethylene glycol powder (GLYCOLAX/MIRALAX) powder Take 1 Container by mouth once. Miralax bowel prep 238gms-Take as directed    [provider]  traMADol (ULTRAM) 50 MG tablet TAKE TWO TABLETS BY MOUTH EVERY 12 HOURS AS NEEDED 09/24/15   Vivi Barrack, MD    Family History Family History  Problem Relation Age of Onset  . Uterine cancer Mother   . Colon cancer Neg Hx   . Rectal cancer Neg Hx   . Stomach cancer Neg Hx   . Esophageal cancer Neg Hx     Social History Social History   Tobacco Use  .  Smoking status: Never Smoker  . Smokeless tobacco: Never Used  Substance Use Topics  . Alcohol use: No  . Drug use: No     Allergies   Patient has no known allergies.   Review of Systems Review of Systems  Constitutional: Negative for fever.  HENT: Positive for sore throat. Negative for congestion, ear pain, rhinorrhea and trouble swallowing.   Respiratory: Positive for cough. Negative for shortness of breath.      Physical Exam Updated Vital Signs BP (!) 184/106 (BP Location: Right Arm)   Pulse 71   Temp 98.4 F (36.9 C) (Oral)   Resp 18   Ht _0  (1.626 m)   Wt 68 kg   SpO2 100%   BMI 25.75 kg/m   Physical Exam Vitals signs and nursing note reviewed.  Constitutional:       General: He is not in acute distress.    Appearance: He is well-developed. He is not ill-appearing.  HENT:     Head: Normocephalic and atraumatic.     Right Ear: There is impacted cerumen.     Left Ear: Tympanic membrane and ear canal normal.     Nose: Nose normal.     Mouth/Throat:     Pharynx: Posterior oropharyngeal erythema present. No oropharyngeal exudate.     Comments: Pharyngeal erythema is present.  No swelling or exudates.  Uvula is midline.  No trismus.  Tolerating secretions. Eyes:     Conjunctiva/sclera: Conjunctivae normal.  Neck:     Musculoskeletal: Normal range of motion and neck supple. No neck rigidity.  Cardiovascular:     Rate and Rhythm: Normal rate and regular rhythm.  Pulmonary:     Effort: Pulmonary effort is normal. No respiratory distress.     Breath sounds: Normal breath sounds.  Lymphadenopathy:     Cervical: No cervical adenopathy.  Neurological:     Mental Status: He is alert.  Psychiatric:        Mood and Affect: Mood normal.        Behavior: Behavior normal.      ED Treatments / Results  Labs (all labs ordered are listed, but only abnormal results are displayed) Labs Reviewed - No data to display  EKG None  Radiology No results found.  Procedures Procedures (including critical care time)  Medications Ordered in ED Medications - No data to display   Initial Impression / Assessment and Plan / ED Course  I have reviewed the triage vital signs and the nursing notes.  Pertinent labs & imaging results that were available during my care of the patient were reviewed by me and considered in my medical decision making (see chart for details).        Patients symptoms are consistent with URI, likely viral etiology. Afebrile, tolerating secretions.  Lungs clear to auscultation bilaterally. Cough is dry. Discussed that antibiotics are not indicated for viral infections. Pt will be discharged with symptomatic treatment.  Verbalizes  understanding and is agreeable with plan. Pt is hemodynamically stable & in NAD prior to dc.  Discussed results, findings, treatment and follow up. Patient advised of return precautions. Patient verbalized understanding and agreed with plan.   Final Clinical Impressions(s) / ED Diagnoses   Final diagnoses:  Viral URI with cough    ED Discharge Orders    None       , [place] N, PA-C 06/24/18 1033    Blanchie Dessert, MD 06/25/18 1949

## 2018-06-24 NOTE — ED Triage Notes (Signed)
Pt presents with a dry cough and "I feel kind of weak."  Pt reports his wife is "really sick" with a "really bad cough".  Pt denies fever, runny nose or any upper respiratory symptoms.

## 2018-06-24 NOTE — Discharge Instructions (Addendum)
Please read instructions below.  You can take tylenol every 4 hours as needed for sore throat or fever.  Drink plenty of water.  Use saline nasal spray for congestion. Follow up with your primary care provider as needed.  Return to the ER for inability to swallow liquids, difficulty breathing, or new or worsening symptoms.

## 2018-11-10 ENCOUNTER — Ambulatory Visit (INDEPENDENT_AMBULATORY_CARE_PROVIDER_SITE_OTHER): Payer: Medicare HMO

## 2018-11-10 ENCOUNTER — Ambulatory Visit (INDEPENDENT_AMBULATORY_CARE_PROVIDER_SITE_OTHER): Payer: Medicare HMO | Admitting: Physician Assistant

## 2018-11-10 ENCOUNTER — Encounter: Payer: Self-pay | Admitting: Physician Assistant

## 2018-11-10 VITALS — Ht 64.0 in | Wt 140.0 lb

## 2018-11-10 DIAGNOSIS — M25551 Pain in right hip: Secondary | ICD-10-CM

## 2018-11-10 NOTE — Progress Notes (Signed)
Office Visit Note   Patient: Victor Santiago           Date of Birth: 09-Sep-1955           MRN: [id number] Visit Date: 11/10/2018              Requested by: Rory Percy, DO [address],  [place] [address] PCP: Rory Percy, DO   Assessment & Plan: Visit Diagnoses:  1. Pain in right hip     Plan: Due to the fact the patient's right hip pain is becoming worse and are affecting his daily activities.  Along with the radiographic findings recommend right total hip arthroplasty.  He like to proceed with this in the near future.  He understands the risk benefits of surgery as he is had this come before.  He understands that risk include but are not limited to nerve/vessel injury, DVT/PE, infection, wound healing problems and prolonged pain.  We will see him back 2 weeks postop.  He is given Samella Parr card and will call her once he is ready to schedule surgery.  Follow-Up Instructions: Return for 2 weeks postop.   Orders:  Orders Placed This Encounter  Procedures  . XR HIP UNILAT W OR W/O PELVIS 1V RIGHT   No orders of the defined types were placed in this encounter.     Procedures: No procedures performed   Clinical Data: No additional findings.   Subjective: Chief Complaint  Patient presents with  . Right Hip - Pain    HPI Mr. Victor Santiago is a 63 year old male well-known to our department service comes in today wanting to discuss right total hip arthroplasty.  He states he is having worsening pain in the right hip.  He has known significant arthritis of his right hip.  He states that he is having harder time is doing his daily activities.  He denies any fevers chills chest pain shortness of breath or infection. Review of Systems Please see HPI otherwise negative  Objective: Vital Signs: Ht 5\' 4"  (1.626 m)   Wt 140 lb (63.5 kg)   BMI 24.03 kg/m   Physical Exam General: Patient is a thin well-developed male in no acute distress. Psych: Alert and  oriented x3  Ortho Exam Right hip pain with internal and external rotation.  Rotation is extremely limited on the right.  Good range of motion left hip without pain.  Bilateral calves supple nontender.  Dorsiflexion plantarflexion bilateral ankles intact.  Leg lengths are equal. Specialty Comments:  No specialty comments available.  Imaging: Xr Hip Unilat W Or W/o Pelvis 1v Right  Result Date: 11/10/2018 AP pelvis and lateral view of the right hip: No acute fractures.  Left total hip arthroplasty components well-seated no signs of hardware failure.  Right hip near bone-on-bone arthritic changes.  Osteophytes off the acetabulum the inferior aspect of the femoral head.  Bilateral hips are well located.    PMFS History: Patient Active Problem List   Diagnosis Date Noted  . Arthritis of right hip 04/23/2015  . Insomnia 05/19/2014  . HTN (hypertension) 03/15/2014  . Healthcare maintenance 03/15/2014  . Arthritis of left hip 11/18/2013  . Status post total replacement of left hip 11/18/2013   Past Medical History:  Diagnosis Date  . Allergy    seasonal/ hay fever  . Arthritis    hip/pt uses cane  . Hypertension     Family History  Problem Relation Age of Onset  . Uterine cancer Mother   .  Colon cancer Neg Hx   . Rectal cancer Neg Hx   . Stomach cancer Neg Hx   . Esophageal cancer Neg Hx     Past Surgical History:  Procedure Laterality Date  . TONSILLECTOMY     as child  . TOTAL HIP ARTHROPLASTY Left 11/18/2013   Procedure: LEFT TOTAL HIP ARTHROPLASTY ANTERIOR APPROACH;  Surgeon: Kathryne Hitchhristopher Y Blackman, MD;  Location: [facility];  Service: Orthopedics;  Laterality: Left;   Social History   Occupational History  . Not on file  Tobacco Use  . Smoking status: Never Smoker  . Smokeless tobacco: Never Used  Substance and Sexual Activity  . Alcohol use: No  . Drug use: No  . Sexual activity: Not on file

## 2020-01-16 ENCOUNTER — Encounter: Payer: Self-pay | Admitting: Physician Assistant

## 2020-01-16 ENCOUNTER — Ambulatory Visit (INDEPENDENT_AMBULATORY_CARE_PROVIDER_SITE_OTHER): Payer: Medicare Other | Admitting: Physician Assistant

## 2020-01-16 VITALS — Ht 66.54 in | Wt 142.4 lb

## 2020-01-16 DIAGNOSIS — M1611 Unilateral primary osteoarthritis, right hip: Secondary | ICD-10-CM | POA: Diagnosis not present

## 2020-01-16 NOTE — Progress Notes (Signed)
Office Visit Note   Patient: Victor Santiago           Date of Birth: 07-Jan-1956           MRN: [id number] Visit Date: 01/16/2020              Requested by: Caro Laroche, DO [address] [place],  [place] [address] PCP: Caro Laroche, DO   Assessment & Plan: Visit Diagnoses:  1. Primary osteoarthritis of right hip     Plan: Patient has end-stage arthritis of the right hip that is affecting his daily living.  He has failed conservative management which is included pain medication, modification of activities and the use of assistive device and continues to have pain in the right hip.  Discussed with him at length total hip arthroplasty surgery.  Risk benefits of surgery discussed with him again.  Risk include but are not limited to DVT/PE, infection, wound healing problems, nerve or vessel injury.  Patient was given Domenica Reamer card we will set him up for surgery sometime in late December early January.  Again he requests skilled nursing facility placement postop.   Questions were encouraged and answered at length  Follow-Up Instructions: Return 2 weeks post op.   Orders:  No orders of the defined types were placed in this encounter.  No orders of the defined types were placed in this encounter.     Procedures: No procedures performed   Clinical Data: No additional findings.   Subjective: Chief Complaint  Patient presents with  . Right Hip - Pain    HPI  [name] returns today follow-up of his right hip pain.  We last saw him in August 2020 and had discussed with him at length about undergoing right total hip arthroplasty.  He states that due to the fact that his wife passed away in Aug 10, 2017 and has had no one at home to help him postoperatively as he did undergo he had a total hip arthroplasty on the left by Dr. Magnus Ivan in 08/10/13 he has been reluctant to schedule surgery.  However his pain is becoming to the point that he is having difficulty putting on shoes socks.   Having significant groin pain.  Has to use a cane at times due to the pain in the right hip.  He has had no new injury to the right hip.  He is here today wanting to discuss setting up right total hip surgery.  Review of Systems  Constitutional: Negative for chills and fever.  Respiratory: Negative for shortness of breath.   Cardiovascular: Negative for chest pain.  Gastrointestinal: Positive for constipation.  Musculoskeletal: Positive for gait problem.     Objective: Vital Signs: Ht 5' 6.54" (1.69 m)   Wt 142 lb 6.4 oz (64.6 kg)   BMI 22.62 kg/m   Physical Exam Constitutional:      Appearance: He is normal weight. He is not ill-appearing or diaphoretic.  Pulmonary:     Effort: Pulmonary effort is normal.  Neurological:     Mental Status: He is alert and oriented to person, place, and time.  Psychiatric:        Mood and Affect: Mood normal.     Ortho Exam Left hip full range of motion without pain.  Nontender over left trochanteric region.  Right hip very limited and painful internal and external rotation.  Right calf supple nontender.  Ambulates without any assistive device with an antalgic gait on the right. Specialty  Comments:  No specialty comments available.  Imaging: No results found.   PMFS History: Patient Active Problem List   Diagnosis Date Noted  . Presbycusis of both ears 02/23/2018  . Arthritis of right hip 04/23/2015  . Insomnia 05/19/2014  . HTN (hypertension) 03/15/2014  . Healthcare maintenance 03/15/2014  . Arthritis of left hip 11/18/2013  . Status post total replacement of left hip 11/18/2013   Past Medical History:  Diagnosis Date  . Allergy    seasonal/ hay fever  . Arthritis    hip/pt uses cane  . Hypertension     Family History  Problem Relation Age of Onset  . Uterine cancer Mother   . Colon cancer Neg Hx   . Rectal cancer Neg Hx   . Stomach cancer Neg Hx   . Esophageal cancer Neg Hx     Past Surgical History:  Procedure  Laterality Date  . TONSILLECTOMY     as child  . TOTAL HIP ARTHROPLASTY Left 11/18/2013   Procedure: LEFT TOTAL HIP ARTHROPLASTY ANTERIOR APPROACH;  Surgeon: Kathryne Hitch, MD;  Location: [facility];  Service: Orthopedics;  Laterality: Left;   Social History   Occupational History  . Not on file  Tobacco Use  . Smoking status: Never Smoker  . Smokeless tobacco: Never Used  Substance and Sexual Activity  . Alcohol use: No  . Drug use: No  . Sexual activity: Not on file

## 2020-04-09 ENCOUNTER — Other Ambulatory Visit: Payer: Self-pay | Admitting: Physician Assistant

## 2020-04-10 ENCOUNTER — Other Ambulatory Visit: Payer: Self-pay

## 2020-04-12 NOTE — Patient Instructions (Signed)
DUE TO COVID-19 ONLY ONE VISITOR IS ALLOWED TO COME WITH YOU AND STAY IN THE WAITING ROOM ONLY DURING PRE OP AND PROCEDURE DAY OF SURGERY. THE 1 VISITOR  MAY VISIT WITH YOU AFTER SURGERY IN YOUR PRIVATE ROOM DURING VISITING HOURS ONLY!  YOU NEED TO HAVE A COVID 19 TEST ON__1/11_____ @_2 :45 PM_____, THIS TEST MUST BE DONE BEFORE SURGERY,  COVID TESTING SITE [address] [place] , IT IS ON THE RIGHT GOING OUT [address] APPROXIMATELY  2 MINUTES PAST [employer] ON THE RIGHT. ONCE YOUR COVID TEST IS COMPLETED,  PLEASE BEGIN THE QUARANTINE INSTRUCTIONS AS OUTLINED IN YOUR HANDOUT.                [id number]    Your procedure is scheduled on: 1/14?22   Report to [facility] Main  Entrance   Report to admitting at  6:00 AM     Call this number if you have problems the morning of surgery [phone]    BRUSH YOUR TEETH MORNING OF SURGERY AND RINSE YOUR MOUTH OUT, NO CHEWING GUM CANDY OR MINTS.   No food after midnight.    You may have clear liquid until 5:30 AM.    At 5:00 AM drink pre surgery drink.   Nothing by mouth after 5:30 AM.   Take these medicines the morning of surgery with A SIP OF WATER: None  DO NOT TAKE ANY DIABETIC MEDICATIONS DAY OF YOUR SURGERY                               You may not have any metal on your body including              piercings  Do not wear jewelry, lotions, powders or deodorant                         Men may shave face and neck.   Do not bring valuables to the hospital. [facility] IS NOT             RESPONSIBLE   FOR VALUABLES.  Contacts, dentures or bridgework may not be worn into surgery.       Patients discharged the day of surgery will not be allowed to drive home.   IF YOU ARE HAVING SURGERY AND GOING HOME THE SAME DAY, YOU MUST HAVE AN ADULT TO DRIVE YOU HOME AND BE WITH YOU FOR 24 HOURS.   YOU MAY GO HOME BY TAXI OR UBER OR ORTHERWISE, BUT AN ADULT MUST ACCOMPANY YOU HOME AND STAY WITH  YOU FOR 24 HOURS.  Name and phone number of your driver:  Special Instructions: N/A              Please read over the following fact sheets you were given: _____________________________________________________________________             [facility] - Preparing for Surgery Before surgery, you can play an important role.   Because skin is not sterile, your skin needs to be as free of germs as possible.   You can reduce the number of germs on your skin by washing with CHG (chlorahexidine gluconate) soap before surgery .  CHG is an antiseptic cleaner which kills germs and bonds with the skin to continue killing germs even after washing. Please DO NOT use if you have an allergy to CHG or antibacterial  soaps .  If your skin becomes reddened/irritated stop using the CHG and inform your nurse when you arrive at Short Stay. .  You may shave your face/neck.  Please follow these instructions carefully:  1.  Shower with CHG Soap the night before surgery and the  morning of Surgery.  2.  If you choose to wash your hair, wash your hair first as usual with your  normal  shampoo.  3.  After you shampoo, rinse your hair and body thoroughly to remove the  shampoo.                                        4.  Use CHG as you would any other liquid soap.  You can apply chg directly  to the skin and wash                       Gently with a scrungie or clean washcloth.  5.  Apply the CHG Soap to your body ONLY FROM THE NECK DOWN.   Do not use on face/ open                           Wound or open sores. Avoid contact with eyes, ears mouth and genitals (private parts).                       Wash face,  Genitals (private parts) with your normal soap.             6.  Wash thoroughly, paying special attention to the area where your surgery  will be performed.  7.  Thoroughly rinse your body with warm water from the neck down.  8.  DO NOT shower/wash with your normal soap after using and rinsing off  the CHG Soap.              9.  Pat yourself dry with a clean towel.            10.  Wear clean pajamas.            11.  Place clean sheets on your bed the night of your first shower and do not  sleep with pets. Day of Surgery : Do not apply any lotions/deodorants the morning of surgery.  Please wear clean clothes to the hospital/surgery center.  FAILURE TO FOLLOW THESE INSTRUCTIONS MAY RESULT IN THE CANCELLATION OF YOUR SURGERY PATIENT SIGNATURE_________________________________  NURSE SIGNATURE__________________________________  ________________________________________________________________________   Victor Santiago  An incentive spirometer is a tool that can help keep your lungs clear and active. This tool measures how well you are filling your lungs with each breath. Taking long deep breaths may help reverse or decrease the chance of developing breathing (pulmonary) problems (especially infection) following:  A long period of time when you are unable to move or be active. BEFORE THE PROCEDURE   If the spirometer includes an indicator to show your best effort, your nurse or respiratory therapist will set it to a desired goal.  If possible, sit up straight or lean slightly forward. Try not to slouch.  Hold the incentive spirometer in an upright position. INSTRUCTIONS FOR USE  1. Sit on the edge of your bed if possible, or sit up as far as you can in bed or on a chair. 2. Hold  the incentive spirometer in an upright position. 3. Breathe out normally. 4. Place the mouthpiece in your mouth and seal your lips tightly around it. 5. Breathe in slowly and as deeply as possible, raising the piston or the ball toward the top of the column. 6. Hold your breath for 3-5 seconds or for as long as possible. Allow the piston or ball to fall to the bottom of the column. 7. Remove the mouthpiece from your mouth and breathe out normally. 8. Rest for a few seconds and repeat Steps 1 through 7 at least 10 times  every 1-2 hours when you are awake. Take your time and take a few normal breaths between deep breaths. 9. The spirometer may include an indicator to show your best effort. Use the indicator as a goal to work toward during each repetition. 10. After each set of 10 deep breaths, practice coughing to be sure your lungs are clear. If you have an incision (the cut made at the time of surgery), support your incision when coughing by placing a pillow or rolled up towels firmly against it. Once you are able to get out of bed, walk around indoors and cough well. You may stop using the incentive spirometer when instructed by your caregiver.  RISKS AND COMPLICATIONS  Take your time so you do not get dizzy or light-headed.  If you are in pain, you may need to take or ask for pain medication before doing incentive spirometry. It is harder to take a deep breath if you are having pain. AFTER USE  Rest and breathe slowly and easily.  It can be helpful to keep track of a log of your progress. Your caregiver can provide you with a simple table to help with this. If you are using the spirometer at home, follow these instructions: SEEK MEDICAL CARE IF:   You are having difficultly using the spirometer.  You have trouble using the spirometer as often as instructed.  Your pain medication is not giving enough relief while using the spirometer.  You develop fever of 100.5 F (38.1 C) or higher. SEEK IMMEDIATE MEDICAL CARE IF:   You cough up bloody sputum that had not been present before.  You develop fever of 102 F (38.9 C) or greater.  You develop worsening pain at or near the incision site. MAKE SURE YOU:   Understand these instructions.  Will watch your condition.  Will get help right away if you are not doing well or get worse. Document Released: 08/04/2006 Document Revised: 06/16/2011 Document Reviewed: 10/05/2006 [facility] Patient Information 2014 [place],  [place].   ________________________________________________________________________

## 2020-04-17 ENCOUNTER — Other Ambulatory Visit: Payer: Self-pay

## 2020-04-17 ENCOUNTER — Encounter (HOSPITAL_COMMUNITY)
Admission: RE | Admit: 2020-04-17 | Discharge: 2020-04-17 | Disposition: A | Discharge status: Home / Self Care | Payer: 59 | Source: Ambulatory Visit | Attending: Orthopaedic Surgery | Admitting: Orthopaedic Surgery

## 2020-04-17 ENCOUNTER — Other Ambulatory Visit (HOSPITAL_COMMUNITY): Payer: Medicaid Other

## 2020-04-17 ENCOUNTER — Encounter (HOSPITAL_COMMUNITY): Payer: Self-pay

## 2020-04-17 DIAGNOSIS — I10 Essential (primary) hypertension: Secondary | ICD-10-CM | POA: Diagnosis not present

## 2020-04-17 DIAGNOSIS — Z01818 Encounter for other preprocedural examination: Secondary | ICD-10-CM | POA: Diagnosis present

## 2020-04-17 LAB — CBC
HCT: 46.6 % (ref 39.0–52.0)
Hemoglobin: 15.7 g/dL (ref 13.0–17.0)
MCH: 31.5 pg (ref 26.0–34.0)
MCHC: 33.7 g/dL (ref 30.0–36.0)
MCV: 93.4 fL (ref 80.0–100.0)
Platelets: 242 10*3/uL (ref 150–400)
RBC: 4.99 MIL/uL (ref 4.22–5.81)
RDW: 13.1 % (ref 11.5–15.5)
WBC: 8.1 10*3/uL (ref 4.0–10.5)
nRBC: 0 % (ref 0.0–0.2)

## 2020-04-17 LAB — SURGICAL PCR SCREEN
MRSA, PCR: NEGATIVE
Staphylococcus aureus: NEGATIVE

## 2020-04-17 NOTE — Progress Notes (Signed)
COVID Vaccine Completed: No Date COVID Vaccine completed: COVID vaccine manufacturer: Pfizer    Moderna   [employer]   PCP - Dr. Celene Skeen Cardiologist - no  Chest x-ray -no  EKG - 04/17/20-chart , Epic Stress Test - no ECHO - no Cardiac Cath - no Pacemaker/ICD device last checked:NA  Sleep Study - NA CPAP -   Fasting Blood Sugar - NA Checks Blood Sugar _____ times a day  Blood Thinner Instructions:NA Aspirin Instructions: Last Dose:  Anesthesia review:   Patient denies shortness of breath, fever, cough and chest pain at PAT appointment yes  Patient verbalized understanding of instructions that were given to them at the PAT appointment. Patient was also instructed that they will need to review over the PAT instructions again at home before surgery. Yes Pt has no SOB with activities. He will call his PCP about BP because the diastolic was in the high 90s. He has concerns about post op care at home because his girlfriend is disabled.He will call Dr. Magnus Ivan.

## 2020-04-18 ENCOUNTER — Telehealth: Payer: Self-pay

## 2020-04-18 NOTE — Telephone Encounter (Signed)
Patient called he would like a call back regarding his upcoming surgery and wants to discuss what can be done. He would like a call back ASAP [phone]

## 2020-04-18 NOTE — Telephone Encounter (Signed)
Wants to talk to you about surgery States he doesn't feel he can do outpatient surgery

## 2020-04-18 NOTE — Telephone Encounter (Signed)
Spoke with patient, he stated his blood pressure was running high at pre-op.  He cancelled surgery for Friday.  He is going to follow up with primary care to get under control and call me back to reschedule surgery.

## 2020-04-20 ENCOUNTER — Encounter (HOSPITAL_COMMUNITY): Admission: RE | Payer: Self-pay | Source: Home / Self Care

## 2020-04-20 ENCOUNTER — Ambulatory Visit (HOSPITAL_COMMUNITY): Admission: RE | Admit: 2020-04-20 | Payer: 59 | Source: Home / Self Care | Admitting: Orthopaedic Surgery

## 2020-04-20 LAB — TYPE AND SCREEN
ABO/RH(D): O NEG
Antibody Screen: NEGATIVE

## 2020-04-20 SURGERY — ARTHROPLASTY, HIP, TOTAL, ANTERIOR APPROACH
Anesthesia: Choice | Site: Hip | Laterality: Right

## 2020-05-03 ENCOUNTER — Inpatient Hospital Stay: Payer: Medicaid Other | Admitting: Orthopaedic Surgery

## 2021-12-25 ENCOUNTER — Ambulatory Visit (INDEPENDENT_AMBULATORY_CARE_PROVIDER_SITE_OTHER): Payer: Medicare Other | Admitting: Orthopaedic Surgery

## 2021-12-25 DIAGNOSIS — M1611 Unilateral primary osteoarthritis, right hip: Secondary | ICD-10-CM | POA: Insufficient documentation

## 2021-12-25 NOTE — Progress Notes (Signed)
The patient is well-known to [place].  He has well-documented severe end-stage arthritis of his right hip.  We have been seeing him for this right hip for well over 3 years now.  We actually replaced his left hip in 2015 through an anterior approach.  He says he is finally at the point where his right hip pain is daily and it is detrimentally affecting his mobility, his quality of life and his actives daily living.  This is got worse over the last 12 months.  He is only 66 years old.  He is thin.  He is worked on activity modification as well as hip strengthening exercises and anti-inflammatories.  He says his left operative hip is done very well.  He does walk with a slight limp.  His left hip moves smoothly and fluidly.  His right hip has significant limitations with internal and external rotation with pain in the groin on rotation.  An AP pelvis and lateral of the right hip even in 2020 showed severe end-stage arthritis of the right hip with cystic changes in the femoral head and acetabulum as well as bone-on-bone wear with loss of the joint space and periarticular osteophytes.  At this point he does wish to proceed with a hip replacement in January of this coming year.  He only has high blood pressure and is working on good control of that.  He is not diabetic.  All questions and concerns were answered and addressed.  We will work on getting him scheduled for right hip replacement in January 2024.

## 2022-01-11 ENCOUNTER — Emergency Department (HOSPITAL_COMMUNITY)
Admission: EM | Admit: 2022-01-11 | Discharge: 2022-01-11 | Disposition: A | Discharge status: Home / Self Care | Payer: Medicare Other | Attending: Emergency Medicine | Admitting: Emergency Medicine

## 2022-01-11 ENCOUNTER — Emergency Department (HOSPITAL_COMMUNITY)
Admission: EM | Admit: 2022-01-11 | Discharge: 2022-01-11 | Discharge status: Against Medical Advice | Payer: Medicare Other

## 2022-01-11 ENCOUNTER — Emergency Department (HOSPITAL_COMMUNITY): Payer: Medicare Other

## 2022-01-11 ENCOUNTER — Encounter (HOSPITAL_COMMUNITY): Payer: Self-pay | Admitting: *Deleted

## 2022-01-11 ENCOUNTER — Other Ambulatory Visit: Payer: Self-pay

## 2022-01-11 DIAGNOSIS — Y92410 Unspecified street and highway as the place of occurrence of the external cause: Secondary | ICD-10-CM | POA: Insufficient documentation

## 2022-01-11 DIAGNOSIS — Z96642 Presence of left artificial hip joint: Secondary | ICD-10-CM | POA: Insufficient documentation

## 2022-01-11 DIAGNOSIS — S7012XA Contusion of left thigh, initial encounter: Secondary | ICD-10-CM | POA: Insufficient documentation

## 2022-01-11 DIAGNOSIS — S7002XA Contusion of left hip, initial encounter: Secondary | ICD-10-CM | POA: Insufficient documentation

## 2022-01-11 DIAGNOSIS — S79912A Unspecified injury of left hip, initial encounter: Secondary | ICD-10-CM | POA: Diagnosis present

## 2022-01-11 MED ORDER — NAPROXEN 500 MG PO TABS: 500.0000 mg | ORAL_TABLET | Freq: Two times a day (BID) | ORAL | 0 refills | Status: DC

## 2022-01-11 MED ORDER — NAPROXEN 250 MG PO TABS
500.0000 mg | ORAL_TABLET | Freq: Once | ORAL | Status: AC
  Administered 2022-01-11: 500 mg via ORAL
  Filled 2022-01-11: qty 2
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
## 2022-01-11 NOTE — Discharge Instructions (Signed)
Your x-rays look normal, no signs of broken bones, your hip surgery looks good, you can see your doctor as needed, you will likely have pain for the next week  Please take Naprosyn, 500mg  by mouth twice daily as needed for pain - this in an antiinflammatory medicine (NSAID) and is similar to ibuprofen - many people feel that it is stronger than ibuprofen and it is easier to take since it is a smaller pill.  Please use this only for 1 week - if your pain persists, you will need to follow up with your doctor in the office for ongoing guidance and pain control.

## 2022-01-14 ENCOUNTER — Emergency Department (HOSPITAL_COMMUNITY)
Admission: EM | Admit: 2022-01-14 | Discharge: 2022-01-14 | Disposition: A | Discharge status: Home / Self Care | Payer: Medicare Other | Attending: Emergency Medicine | Admitting: Emergency Medicine

## 2022-01-14 ENCOUNTER — Other Ambulatory Visit: Payer: Self-pay

## 2022-01-14 ENCOUNTER — Encounter (HOSPITAL_COMMUNITY): Payer: Self-pay

## 2022-01-14 ENCOUNTER — Emergency Department (HOSPITAL_COMMUNITY): Payer: Medicare Other

## 2022-01-14 DIAGNOSIS — S2232XA Fracture of one rib, left side, initial encounter for closed fracture: Secondary | ICD-10-CM | POA: Insufficient documentation

## 2022-01-14 DIAGNOSIS — S299XXA Unspecified injury of thorax, initial encounter: Secondary | ICD-10-CM | POA: Diagnosis present

## 2022-01-14 DIAGNOSIS — Y92410 Unspecified street and highway as the place of occurrence of the external cause: Secondary | ICD-10-CM | POA: Insufficient documentation

## 2022-01-14 NOTE — ED Notes (Signed)
Dc instructions reviewed with pt. Pt demonstrated understanding of IS use. No questions or concerns at this time.
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
## 2022-01-14 NOTE — Discharge Instructions (Signed)
Use incentive spirometer to take good deep breaths to help prevent developing pneumonia.  Use your oxycodone as prescribed.  Follow-up with your doctor.  Can also use over-the-counter lidocaine patches to area of pain for additional pain relief.

## 2022-01-31 ENCOUNTER — Ambulatory Visit: Payer: Medicare Other | Admitting: Orthopaedic Surgery

## 2022-03-07 ENCOUNTER — Ambulatory Visit (INDEPENDENT_AMBULATORY_CARE_PROVIDER_SITE_OTHER): Payer: Medicare Other | Admitting: Surgery

## 2022-03-07 ENCOUNTER — Ambulatory Visit: Payer: Self-pay

## 2022-03-07 DIAGNOSIS — M7062 Trochanteric bursitis, left hip: Secondary | ICD-10-CM | POA: Diagnosis not present

## 2022-03-07 DIAGNOSIS — M25552 Pain in left hip: Secondary | ICD-10-CM | POA: Diagnosis not present
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
## 2022-03-13 ENCOUNTER — Encounter: Payer: Self-pay | Admitting: Surgery

## 2022-04-09 ENCOUNTER — Encounter: Payer: Self-pay | Admitting: Orthopaedic Surgery

## 2022-04-09 ENCOUNTER — Ambulatory Visit (INDEPENDENT_AMBULATORY_CARE_PROVIDER_SITE_OTHER): Payer: Medicare HMO | Admitting: Orthopaedic Surgery

## 2022-04-09 DIAGNOSIS — Z96642 Presence of left artificial hip joint: Secondary | ICD-10-CM

## 2022-04-09 DIAGNOSIS — M1611 Unilateral primary osteoarthritis, right hip: Secondary | ICD-10-CM

## 2022-04-09 NOTE — Progress Notes (Signed)
HPI: Victor Santiago comes in today follow-up status post left hip trochanteric injection.  History of left total hip arthroplasty 2015.  States his left hip is doing well.  He is scheduled for right total hip arthroplasty 05/30/2022.  He has no questions as he has had prior hip surgery.  Main concern today is weight loss he feels that is due to his blood pressure medicines being changed.  He reports that he is lost 20 pounds in the last 2 months.  He denies any fevers chills.  States that he has had change in bowel movements and has difficulty going to the restroom.  States he had fecal test sent off that was negative for colon screening.  Remote history of colonoscopy but unsure of how long ago this was.  Review of systems see HPI otherwise negative  Physical exam: General well-developed well-nourished male no acute distress.  Impression: Left hip bursitis improved Right hip end-stage osteoarthritis  Plan: Dr. Ninfa Linden I spoke with him he needs to return to his primary care physician and talk to them about his constipation and change in bowel movements along with the weight loss prior to his surgery in February.  He will keep [place] informed in regards to any change in his medical status or need to postpone surgery.

## 2022-04-17 ENCOUNTER — Other Ambulatory Visit: Payer: Self-pay

## 2022-04-28 ENCOUNTER — Telehealth: Payer: Self-pay

## 2022-04-28 ENCOUNTER — Telehealth: Payer: Self-pay | Admitting: Orthopaedic Surgery

## 2022-04-28 NOTE — Telephone Encounter (Signed)
Received voice mail from woman stating she was Leland Raver, patient's daughter [phone], requesting call about patient's upcoming surgery.  I found no authorization to speak with her.  I called patient regarding this and no answer at [phone].

## 2022-05-09 ENCOUNTER — Telehealth: Payer: Self-pay | Admitting: Orthopaedic Surgery

## 2022-05-09 NOTE — Telephone Encounter (Signed)
I called patient and advised that he would begin getting information from the hospital next week once the surgery was entered in the OR schedule.  I will try to facilitate making sure he gets all communications from hospital, especially to set up pre-op appointment.

## 2022-05-09 NOTE — Telephone Encounter (Signed)
Patient called triage line stating that he is supposed to be scheduled for a right hip replacement on 05/30/22. He says he hasn't heard anything further about this. He would like a call back. [phone]. Thanks!

## 2022-05-19 ENCOUNTER — Other Ambulatory Visit: Payer: Self-pay | Admitting: Physician Assistant

## 2022-05-19 DIAGNOSIS — Z01818 Encounter for other preprocedural examination: Secondary | ICD-10-CM

## 2022-05-20 ENCOUNTER — Other Ambulatory Visit: Payer: Self-pay

## 2022-05-20 NOTE — Patient Instructions (Addendum)
DUE TO COVID-19 ONLY TWO VISITORS  (aged 66 and older)  ARE ALLOWED TO COME WITH YOU AND STAY IN THE WAITING ROOM ONLY DURING PRE OP AND PROCEDURE.   **NO VISITORS ARE ALLOWED IN THE SHORT STAY AREA OR RECOVERY ROOM!!**  IF YOU WILL BE ADMITTED INTO THE HOSPITAL YOU ARE ALLOWED ONLY FOUR SUPPORT PEOPLE DURING VISITATION HOURS ONLY (7 AM -8PM)   The support person(s) must pass our screening, gel in and out, and wear a mask at all times, including in the patient's room. Patients must also wear a mask when staff or their support person are in the room. Visitors GUEST BADGE MUST BE WORN VISIBLY  One adult visitor may remain with you overnight and MUST be in the room by 8 P.M.     Your procedure is scheduled on: Friday, 05/30/22   Report to [facility] Main Entrance    Report to admitting at : 8:45 AM   Call this number if you have problems the morning of surgery [phone]   Do not eat food :After Midnight.   After Midnight you may have the following liquids until : 8:15 AM DAY OF SURGERY  Water Black Coffee (sugar ok, NO MILK/CREAM OR CREAMERS)  Tea (sugar ok, NO MILK/CREAM OR CREAMERS) regular and decaf                             Plain Jell-O (NO RED)                                           Fruit ices (not with fruit pulp, NO RED)                                     Popsicles (NO RED)                                                                  Juice: apple, WHITE grape, WHITE cranberry Sports drinks like Gatorade (NO RED)   The day of surgery:  Drink ONE (1) Pre-Surgery Clear Ensure or G2 at: 8:15 AM the morning of surgery. Drink in one sitting. Do not sip.  This drink was given to you during your hospital  pre-op appointment visit. Nothing else to drink after completing the  Pre-Surgery Clear Ensure.          If you have questions, please contact your surgeon's office.   Oral Hygiene is also important to reduce your risk of infection.                                     Remember - BRUSH YOUR TEETH THE MORNING OF SURGERY WITH YOUR REGULAR TOOTHPASTE  DENTURES WILL BE REMOVED PRIOR TO SURGERY PLEASE DO NOT APPLY "Poly grip" OR ADHESIVES!!!   Do NOT smoke after Midnight   Take these medicines the morning of surgery with A SIP OF WATER: Oxycodone if needed   DO NOT TAKE  CIALIS 24 HOURS PRIOR TO SURGERY                              You may not have any metal on your body including hair pins, jewelry, and body piercing             Do not wear lotions, powders, perfumes/cologne, or deodorant              Men may shave face and neck.   Do not bring valuables to the hospital. [place].   Contacts, glasses, or bridgework may not be worn into surgery.   Bring small overnight bag day of surgery.   DO NOT [place]. PHARMACY WILL DISPENSE MEDICATIONS LISTED ON YOUR MEDICATION LIST TO YOU DURING YOUR ADMISSION [facility]!    Patients discharged on the day of surgery will not be allowed to drive home.  Someone NEEDS to stay with you for the first 24 hours after anesthesia.   Special Instructions: Bring a copy of your healthcare power of attorney and living will documents         the day of surgery if you haven't scanned them before.              Please read over the following fact sheets you were given: IF YOU HAVE QUESTIONS ABOUT YOUR PRE-OP INSTRUCTIONS PLEASE CALL [phone]    [facility] - Preparing for Surgery Before surgery, you can play an important role.  Because skin is not sterile, your skin needs to be as free of germs as possible.  You can reduce the number of germs on your skin by washing with CHG (chlorahexidine gluconate) soap before surgery.  CHG is an antiseptic cleaner which kills germs and bonds with the skin to continue killing germs even after washing. Please DO NOT use if you have an allergy to CHG or antibacterial soaps.  If your skin  becomes reddened/irritated stop using the CHG and inform your nurse when you arrive at Short Stay. Do not shave (including legs and underarms) for at least 48 hours prior to the first CHG shower.  You may shave your face/neck. Please follow these instructions carefully:  1.  Shower with CHG Soap the night before surgery and the  morning of Surgery.  2.  If you choose to wash your hair, wash your hair first as usual with your  normal  shampoo.  3.  After you shampoo, rinse your hair and body thoroughly to remove the  shampoo.                           4.  Use CHG as you would any other liquid soap.  You can apply chg directly  to the skin and wash                       Gently with a scrungie or clean washcloth.  5.  Apply the CHG Soap to your body ONLY FROM THE NECK DOWN.   Do not use on face/ open                           Wound or open sores. Avoid contact with eyes, ears  mouth and genitals (private parts).                       Wash face,  Genitals (private parts) with your normal soap.             6.  Wash thoroughly, paying special attention to the area where your surgery  will be performed.  7.  Thoroughly rinse your body with warm water from the neck down.  8.  DO NOT shower/wash with your normal soap after using and rinsing off  the CHG Soap.                9.  Pat yourself dry with a clean towel.            10.  Wear clean pajamas.            11.  Place clean sheets on your bed the night of your first shower and do not  sleep with pets. Day of Surgery : Do not apply any lotions/deodorants the morning of surgery.  Please wear clean clothes to the hospital/surgery center.  FAILURE TO FOLLOW THESE INSTRUCTIONS MAY RESULT IN THE CANCELLATION OF YOUR SURGERY PATIENT SIGNATURE_________________________________  NURSE SIGNATURE__________________________________  ________________________________________________________________________   Victor Santiago  An incentive spirometer is a  tool that can help keep your lungs clear and active. This tool measures how well you are filling your lungs with each breath. Taking long deep breaths may help reverse or decrease the chance of developing breathing (pulmonary) problems (especially infection) following: A long period of time when you are unable to move or be active. BEFORE THE PROCEDURE  If the spirometer includes an indicator to show your best effort, your nurse or respiratory therapist will set it to a desired goal. If possible, sit up straight or lean slightly forward. Try not to slouch. Hold the incentive spirometer in an upright position. INSTRUCTIONS FOR USE  Sit on the edge of your bed if possible, or sit up as far as you can in bed or on a chair. Hold the incentive spirometer in an upright position. Breathe out normally. Place the mouthpiece in your mouth and seal your lips tightly around it. Breathe in slowly and as deeply as possible, raising the piston or the ball toward the top of the column. Hold your breath for 3-5 seconds or for as long as possible. Allow the piston or ball to fall to the bottom of the column. Remove the mouthpiece from your mouth and breathe out normally. Rest for a few seconds and repeat Steps 1 through 7 at least 10 times every 1-2 hours when you are awake. Take your time and take a few normal breaths between deep breaths. The spirometer may include an indicator to show your best effort. Use the indicator as a goal to work toward during each repetition. After each set of 10 deep breaths, practice coughing to be sure your lungs are clear. If you have an incision (the cut made at the time of surgery), support your incision when coughing by placing a pillow or rolled up towels firmly against it. Once you are able to get out of bed, walk around indoors and cough well. You may stop using the incentive spirometer when instructed by your caregiver.  RISKS AND COMPLICATIONS Take your time so you do not  get dizzy or light-headed. If you are in pain, you may need to take or ask for pain medication before doing incentive spirometry. It  is harder to take a deep breath if you are having pain. AFTER USE Rest and breathe slowly and easily. It can be helpful to keep track of a log of your progress. Your caregiver can provide you with a simple table to help with this. If you are using the spirometer at home, follow these instructions: [place] IF:  You are having difficultly using the spirometer. You have trouble using the spirometer as often as instructed. Your pain medication is not giving enough relief while using the spirometer. You develop fever of 100.5 F (38.1 C) or higher. SEEK IMMEDIATE MEDICAL CARE IF:  You cough up bloody sputum that had not been present before. You develop fever of 102 F (38.9 C) or greater. You develop worsening pain at or near the incision site. MAKE SURE YOU:  Understand these instructions. Will watch your condition. Will get help right away if you are not doing well or get worse. Document Released: 08/04/2006 Document Revised: 06/16/2011 Document Reviewed: 10/05/2006 [facility] Patient Information 2014 [place], [place].   WHAT IS A BLOOD TRANSFUSION? Blood Transfusion Information  A transfusion is the replacement of blood or some of its parts. Blood is made up of multiple cells which provide different functions. Red blood cells carry oxygen and are used for blood loss replacement. White blood cells fight against infection. Platelets control bleeding. Plasma helps clot blood. Other blood products are available for specialized needs, such as hemophilia or other clotting disorders. BEFORE THE TRANSFUSION  Who gives blood for transfusions?  Healthy volunteers who are fully evaluated to make sure their blood is safe. This is blood bank blood. Transfusion therapy is the safest it has ever been in the practice of medicine. Before blood is taken from a  donor, a complete history is taken to make sure that person has no history of diseases nor engages in risky social behavior (examples are intravenous drug use or sexual activity with multiple partners). The donor's travel history is screened to minimize risk of transmitting infections, such as malaria. The donated blood is tested for signs of infectious diseases, such as HIV and hepatitis. The blood is then tested to be sure it is compatible with you in order to minimize the chance of a transfusion reaction. If you or a relative donates blood, this is often done in anticipation of surgery and is not appropriate for emergency situations. It takes many days to process the donated blood. RISKS AND COMPLICATIONS Although transfusion therapy is very safe and saves many lives, the main dangers of transfusion include:  Getting an infectious disease. Developing a transfusion reaction. This is an allergic reaction to something in the blood you were given. Every precaution is taken to prevent this. The decision to have a blood transfusion has been considered carefully by your caregiver before blood is given. Blood is not given unless the benefits outweigh the risks. AFTER THE TRANSFUSION Right after receiving a blood transfusion, you will usually feel much better and more energetic. This is especially true if your red blood cells have gotten low (anemic). The transfusion raises the level of the red blood cells which carry oxygen, and this usually causes an energy increase. The nurse administering the transfusion will monitor you carefully for complications. HOME CARE INSTRUCTIONS  No special instructions are needed after a transfusion. You may find your energy is better. Speak with your caregiver about any limitations on activity for underlying diseases you may have. SEEK MEDICAL CARE IF:  Your condition is not improving  after your transfusion. You develop redness or irritation at the intravenous (IV) site. SEEK  IMMEDIATE MEDICAL CARE IF:  Any of the following symptoms occur over the next 12 hours: Shaking chills. You have a temperature by mouth above 102 F (38.9 C), not controlled by medicine. Chest, back, or muscle pain. People around you feel you are not acting correctly or are confused. Shortness of breath or difficulty breathing. Dizziness and fainting. You get a rash or develop hives. You have a decrease in urine output. Your urine turns a dark color or changes to pink, red, or brown. Any of the following symptoms occur over the next 10 days: You have a temperature by mouth above 102 F (38.9 C), not controlled by medicine. Shortness of breath. Weakness after normal activity. The white part of the eye turns yellow (jaundice). You have a decrease in the amount of urine or are urinating less often. Your urine turns a dark color or changes to pink, red, or brown. Document Released: 03/21/2000 Document Revised: 06/16/2011 Document Reviewed: 11/08/2007 [facility] Patient Information 2014 [place], [place].  _______________________________________________________________________   ________________________________________________________________________

## 2022-05-23 ENCOUNTER — Encounter (HOSPITAL_COMMUNITY): Payer: Self-pay

## 2022-05-23 ENCOUNTER — Encounter (HOSPITAL_COMMUNITY)
Admission: RE | Admit: 2022-05-23 | Discharge: 2022-05-23 | Disposition: A | Discharge status: Home / Self Care | Payer: 59 | Source: Ambulatory Visit | Attending: Orthopaedic Surgery | Admitting: Orthopaedic Surgery

## 2022-05-23 ENCOUNTER — Other Ambulatory Visit: Payer: Self-pay

## 2022-05-23 VITALS — BP 150/87 | HR 63 | Temp 97.8°F | Resp 15 | Ht 64.0 in | Wt 135.1 lb

## 2022-05-23 DIAGNOSIS — I10 Essential (primary) hypertension: Secondary | ICD-10-CM | POA: Diagnosis not present

## 2022-05-23 DIAGNOSIS — Z01818 Encounter for other preprocedural examination: Secondary | ICD-10-CM

## 2022-05-23 HISTORY — DX: Cardiac arrhythmia, unspecified: I49.9

## 2022-05-23 HISTORY — DX: Anesthesia of skin: R20.0

## 2022-05-23 LAB — CBC
HCT: 39.2 % (ref 39.0–52.0)
Hemoglobin: 13.1 g/dL (ref 13.0–17.0)
MCH: 31.4 pg (ref 26.0–34.0)
MCHC: 33.4 g/dL (ref 30.0–36.0)
MCV: 94 fL (ref 80.0–100.0)
Platelets: 228 10*3/uL (ref 150–400)
RBC: 4.17 MIL/uL — ABNORMAL LOW (ref 4.22–5.81)
RDW: 12.8 % (ref 11.5–15.5)
WBC: 6.8 10*3/uL (ref 4.0–10.5)
nRBC: 0 % (ref 0.0–0.2)

## 2022-05-23 LAB — COMPREHENSIVE METABOLIC PANEL
ALT: 18 U/L (ref 0–44)
AST: 24 U/L (ref 15–41)
Albumin: 3.8 g/dL (ref 3.5–5.0)
Alkaline Phosphatase: 70 U/L (ref 38–126)
Anion gap: 5 (ref 5–15)
BUN: 17 mg/dL (ref 8–23)
CO2: 27 mmol/L (ref 22–32)
Calcium: 9.1 mg/dL (ref 8.9–10.3)
Chloride: 105 mmol/L (ref 98–111)
Creatinine, Ser: 1.04 mg/dL (ref 0.61–1.24)
GFR, Estimated: >60 mL/min (ref >60)
Glucose, Bld: 124 mg/dL — ABNORMAL HIGH (ref 70–99)
Potassium: 4.3 mmol/L (ref 3.5–5.1)
Sodium: 137 mmol/L (ref 135–145)
Total Bilirubin: 0.8 mg/dL (ref 0.3–1.2)
Total Protein: 6.5 g/dL (ref 6.5–8.1)

## 2022-05-23 LAB — SURGICAL PCR SCREEN
MRSA, PCR: NEGATIVE
Staphylococcus aureus: NEGATIVE

## 2022-05-23 NOTE — Progress Notes (Addendum)
For Anesthesia: PCP - Guadlupe Spanish, MD  Cardiologist - N/A  Chest x-ray - greater than 1 year in [facility] EKG - 05/23/22 in [facility] Stress Test - N/A ECHO - N/A Cardiac Cath - N/A Pacemaker/ICD device last checked: N/A Pacemaker orders received:N/A Device Rep notified:N/A  Spinal Cord Stimulator: N/A  Sleep Study - N/A CPAP - N/A  Fasting Blood Sugar - N/A Checks Blood Sugar __N/A___ times a day Date and result of last Hgb A1c-N/A  Last dose of GLP1 agonist- N/A GLP1 instructions: N/A  Last dose of SGLT-2 inhibitors- N/A SGLT-2 instructions: N/A  Blood Thinner Instructions:N/A Aspirin Instructions:N/A Last Dose:N/A  Activity level: Can go up a flight of stairs and activities of daily living without stopping and without chest pain and/or shortness of breath    Anesthesia review: N/A  Patient denies shortness of breath, fever, cough and chest pain at PAT appointment   Patient verbalized understanding of instructions that were given to them at the PAT appointment. Patient was also instructed that they will need to review over the PAT instructions again at home before surgery.

## 2022-05-29 NOTE — H&P (Signed)
TOTAL HIP ADMISSION H&P  Patient is admitted for right total hip arthroplasty.  Subjective:  Chief Complaint: right hip pain  HPI: Victor Meuse Sr., 67 y.o. male, has a history of pain and functional disability in the right hip(s) due to arthritis and patient has failed non-surgical conservative treatments for greater than 12 weeks to include NSAID's and/or analgesics, flexibility and strengthening excercises, use of assistive devices, and activity modification.  Onset of symptoms was gradual starting 2 years ago with gradually worsening course since that time.The patient noted no past surgery on the right hip(s).  Patient currently rates pain in the right hip at 10 out of 10 with activity. Patient has night pain, worsening of pain with activity and weight bearing, pain that interfers with activities of daily living, and pain with passive range of motion. Patient has evidence of subchondral cysts, subchondral sclerosis, periarticular osteophytes, and joint space narrowing by imaging studies. This condition presents safety issues increasing the risk of falls.  There is no current active infection.  Patient Active Problem List   Diagnosis Date Noted   Unilateral primary osteoarthritis, right hip 12/25/2021   Presbycusis of both ears 02/23/2018   Arthritis of right hip 04/23/2015   Insomnia 05/19/2014   HTN (hypertension) 03/15/2014   Healthcare maintenance 03/15/2014   Arthritis of left hip 11/18/2013   Status post total replacement of left hip 11/18/2013   Past Medical History:  Diagnosis Date   Allergy    seasonal/ hay fever   Arthritis    hip/pt uses cane   Hypertension    Irregular heart beat    Numbness and tingling in left hand     Past Surgical History:  Procedure Laterality Date   TONSILLECTOMY     as child   TOTAL HIP ARTHROPLASTY Left 11/18/2013   Procedure: LEFT TOTAL HIP ARTHROPLASTY ANTERIOR APPROACH;  Surgeon: Mcarthur Rossetti, MD;  Location: [facility];  Service:  Orthopedics;  Laterality: Left;    No current facility-administered medications for this encounter.   Current Outpatient Medications  Medication Sig Dispense Refill Last Dose   naloxone (NARCAN) nasal spray 4 mg/0.1 mL Place 1 spray into the nose as needed (overdose).      amLODipine (NORVASC) 5 MG tablet Take 5 mg by mouth daily.      bisacodyl (DULCOLAX) 5 MG EC tablet Take 5 mg by mouth. Dulcolax  5 mg bowel prep #4-Take as directed      cloNIDine (CATAPRES) 0.1 MG tablet Take 0.1 mg by mouth daily.      lisinopril (ZESTRIL) 40 MG tablet Take 40 mg by mouth every morning.      Multiple Vitamin (MULTIVITAMIN WITH MINERALS) TABS tablet Take 1 tablet by mouth daily.      naproxen (NAPROSYN) 500 MG tablet Take 1 tablet (500 mg total) by mouth 2 (two) times daily with a meal. 30 tablet 0    Oxycodone HCl 10 MG TABS Take 10 mg by mouth 3 (three) times daily.      tadalafil (CIALIS) 20 MG tablet Take 20 mg by mouth as needed.      No Known Allergies  Social History   Tobacco Use   Smoking status: Never   Smokeless tobacco: Never  Substance Use Topics   Alcohol use: No    Family History  Problem Relation Age of Onset   Uterine cancer Mother    Colon cancer Neg Hx    Rectal cancer Neg Hx    Stomach cancer Neg  Hx    Esophageal cancer Neg Hx      Review of Systems  All other systems reviewed and are negative.   Objective:  Physical Exam Vitals reviewed.  Constitutional:      Appearance: Normal appearance. He is normal weight.  HENT:     Head: Normocephalic and atraumatic.  Eyes:     Extraocular Movements: Extraocular movements intact.     Pupils: Pupils are equal, round, and reactive to light.  Cardiovascular:     Rate and Rhythm: Normal rate and regular rhythm.  Pulmonary:     Effort: Pulmonary effort is normal.     Breath sounds: Normal breath sounds.  Abdominal:     Palpations: Abdomen is soft.  Musculoskeletal:     Cervical back: Normal range of motion and neck  supple.     Right hip: Tenderness and bony tenderness present. Decreased range of motion. Decreased strength.  Neurological:     Mental Status: He is alert and oriented to person, place, and time.  Psychiatric:        Behavior: Behavior normal.     Vital signs in last 24 hours:    Labs:   Estimated body mass index is 23.19 kg/m as calculated from the following:   Height as of 05/23/22: 5' 4"$  (1.626 m).   Weight as of 05/23/22: 61.3 kg.   Imaging Review Plain radiographs demonstrate severe degenerative joint disease of the right hip(s). The bone quality appears to be excellent for age and reported activity level.      Assessment/Plan:  End stage arthritis, right hip(s)  The patient history, physical examination, clinical judgement of the provider and imaging studies are consistent with end stage degenerative joint disease of the right hip(s) and total hip arthroplasty is deemed medically necessary. The treatment options including medical management, injection therapy, arthroscopy and arthroplasty were discussed at length. The risks and benefits of total hip arthroplasty were presented and reviewed. The risks due to aseptic loosening, infection, stiffness, dislocation/subluxation,  thromboembolic complications and other imponderables were discussed.  The patient acknowledged the explanation, agreed to proceed with the plan and consent was signed. Patient is being admitted for inpatient treatment for surgery, pain control, PT, OT, prophylactic antibiotics, VTE prophylaxis, progressive ambulation and ADL's and discharge planning.The patient is planning to be discharged home with home health services

## 2022-05-30 ENCOUNTER — Other Ambulatory Visit: Payer: Self-pay

## 2022-05-30 ENCOUNTER — Ambulatory Visit (HOSPITAL_COMMUNITY): Payer: 59 | Admitting: Certified Registered"

## 2022-05-30 ENCOUNTER — Ambulatory Visit (HOSPITAL_BASED_OUTPATIENT_CLINIC_OR_DEPARTMENT_OTHER): Payer: 59 | Admitting: Certified Registered"

## 2022-05-30 ENCOUNTER — Encounter (HOSPITAL_COMMUNITY)
Admission: RE | Disposition: A | Discharge status: Home / Self Care | Payer: Self-pay | Source: Home / Self Care | Attending: Orthopaedic Surgery

## 2022-05-30 ENCOUNTER — Observation Stay (HOSPITAL_COMMUNITY)
Admission: RE | Admit: 2022-05-30 | Discharge: 2022-06-02 | Disposition: A | Discharge status: Home / Self Care | Payer: 59 | Attending: Orthopaedic Surgery | Admitting: Orthopaedic Surgery

## 2022-05-30 ENCOUNTER — Encounter (HOSPITAL_COMMUNITY): Payer: Self-pay | Admitting: Orthopaedic Surgery

## 2022-05-30 ENCOUNTER — Ambulatory Visit (HOSPITAL_COMMUNITY): Payer: 59

## 2022-05-30 ENCOUNTER — Observation Stay (HOSPITAL_COMMUNITY): Payer: 59

## 2022-05-30 DIAGNOSIS — I10 Essential (primary) hypertension: Secondary | ICD-10-CM | POA: Diagnosis not present

## 2022-05-30 DIAGNOSIS — Z79899 Other long term (current) drug therapy: Secondary | ICD-10-CM | POA: Insufficient documentation

## 2022-05-30 DIAGNOSIS — M1611 Unilateral primary osteoarthritis, right hip: Secondary | ICD-10-CM

## 2022-05-30 DIAGNOSIS — Z96642 Presence of left artificial hip joint: Secondary | ICD-10-CM | POA: Diagnosis not present

## 2022-05-30 DIAGNOSIS — Z96641 Presence of right artificial hip joint: Secondary | ICD-10-CM

## 2022-05-30 HISTORY — PX: TOTAL HIP ARTHROPLASTY: SHX124

## 2022-05-30 LAB — TYPE AND SCREEN
ABO/RH(D): O NEG
Antibody Screen: NEGATIVE

## 2022-05-30 SURGERY — ARTHROPLASTY, HIP, TOTAL, ANTERIOR APPROACH
Anesthesia: Spinal | Site: Hip | Laterality: Right

## 2022-05-30 MED ORDER — MENTHOL 3 MG MT LOZG: 1.0000 | LOZENGE | OROMUCOSAL | Status: DC | PRN

## 2022-05-30 MED ORDER — ONDANSETRON HCL 4 MG/2ML IJ SOLN: 4.0000 mg | Freq: Four times a day (QID) | INTRAMUSCULAR | Status: DC | PRN

## 2022-05-30 MED ORDER — TRANEXAMIC ACID-NACL 1000-0.7 MG/100ML-% IV SOLN
1000.0000 mg | INTRAVENOUS | Status: AC
  Administered 2022-05-30: 1000 mg via INTRAVENOUS
  Filled 2022-05-30: qty 100

## 2022-05-30 MED ORDER — FENTANYL CITRATE PF 50 MCG/ML IJ SOSY
25.0000 ug | PREFILLED_SYRINGE | INTRAMUSCULAR | Status: DC | PRN
  Administered 2022-05-30: 50 ug via INTRAVENOUS

## 2022-05-30 MED ORDER — FENTANYL CITRATE PF 50 MCG/ML IJ SOSY
PREFILLED_SYRINGE | INTRAMUSCULAR | Status: AC
  Filled 2022-05-30: qty 1

## 2022-05-30 MED ORDER — ACETAMINOPHEN 325 MG PO TABS
325.0000 mg | ORAL_TABLET | Freq: Four times a day (QID) | ORAL | Status: DC | PRN
  Administered 2022-05-31: 650 mg via ORAL
  Filled 2022-05-30: qty 2

## 2022-05-30 MED ORDER — DIPHENHYDRAMINE HCL 12.5 MG/5ML PO ELIX: 12.5000 mg | ORAL_SOLUTION | ORAL | Status: DC | PRN

## 2022-05-30 MED ORDER — PROPOFOL 500 MG/50ML IV EMUL
INTRAVENOUS | Status: DC | PRN
  Administered 2022-05-30: 90 ug/kg/min via INTRAVENOUS

## 2022-05-30 MED ORDER — DEXAMETHASONE SODIUM PHOSPHATE 10 MG/ML IJ SOLN
INTRAMUSCULAR | Status: AC
  Filled 2022-05-30: qty 1

## 2022-05-30 MED ORDER — OXYCODONE HCL 5 MG PO TABS
5.0000 mg | ORAL_TABLET | ORAL | Status: DC | PRN
  Administered 2022-05-30 – 2022-06-02 (×12): 10 mg via ORAL
  Filled 2022-05-30 (×14): qty 2

## 2022-05-30 MED ORDER — ACETAMINOPHEN 500 MG PO TABS
1000.0000 mg | ORAL_TABLET | Freq: Once | ORAL | Status: AC
  Administered 2022-05-30: 1000 mg via ORAL
  Filled 2022-05-30: qty 2

## 2022-05-30 MED ORDER — MIDAZOLAM HCL 2 MG/2ML IJ SOLN
INTRAMUSCULAR | Status: AC
  Filled 2022-05-30: qty 2

## 2022-05-30 MED ORDER — CEFAZOLIN SODIUM-DEXTROSE 2-4 GM/100ML-% IV SOLN
2.0000 g | INTRAVENOUS | Status: AC
  Administered 2022-05-30: 2 g via INTRAVENOUS
  Filled 2022-05-30: qty 100

## 2022-05-30 MED ORDER — PROPOFOL 10 MG/ML IV BOLUS
INTRAVENOUS | Status: DC | PRN
  Administered 2022-05-30 (×2): 20 mg via INTRAVENOUS

## 2022-05-30 MED ORDER — SODIUM CHLORIDE 0.9 % IV SOLN: INTRAVENOUS | Status: DC

## 2022-05-30 MED ORDER — ASPIRIN 81 MG PO CHEW
81.0000 mg | CHEWABLE_TABLET | Freq: Two times a day (BID) | ORAL | Status: DC
  Administered 2022-05-30 – 2022-06-02 (×6): 81 mg via ORAL
  Filled 2022-05-30 (×6): qty 1

## 2022-05-30 MED ORDER — CHLORHEXIDINE GLUCONATE 0.12 % MT SOLN
15.0000 mL | Freq: Once | OROMUCOSAL | Status: AC
  Administered 2022-05-30: 15 mL via OROMUCOSAL

## 2022-05-30 MED ORDER — HYDROMORPHONE HCL 1 MG/ML IJ SOLN: 0.5000 mg | INTRAMUSCULAR | Status: DC | PRN

## 2022-05-30 MED ORDER — ONDANSETRON HCL 4 MG/2ML IJ SOLN
INTRAMUSCULAR | Status: AC
  Filled 2022-05-30: qty 2

## 2022-05-30 MED ORDER — DEXAMETHASONE SODIUM PHOSPHATE 10 MG/ML IJ SOLN
INTRAMUSCULAR | Status: DC | PRN
  Administered 2022-05-30: 4 mg via INTRAVENOUS

## 2022-05-30 MED ORDER — SODIUM CHLORIDE 0.9 % IR SOLN
Status: DC | PRN
  Administered 2022-05-30: 1000 mL

## 2022-05-30 MED ORDER — METHOCARBAMOL 500 MG PO TABS
500.0000 mg | ORAL_TABLET | Freq: Four times a day (QID) | ORAL | Status: DC | PRN
  Administered 2022-05-30 – 2022-06-02 (×6): 500 mg via ORAL
  Filled 2022-05-30 (×6): qty 1

## 2022-05-30 MED ORDER — METOCLOPRAMIDE HCL 5 MG PO TABS: 5.0000 mg | ORAL_TABLET | Freq: Three times a day (TID) | ORAL | Status: DC | PRN

## 2022-05-30 MED ORDER — POVIDONE-IODINE 10 % EX SWAB: 2.0000 | Freq: Once | CUTANEOUS | Status: DC

## 2022-05-30 MED ORDER — PHENYLEPHRINE HCL-NACL 20-0.9 MG/250ML-% IV SOLN
INTRAVENOUS | Status: DC | PRN
  Administered 2022-05-30: 40 ug/min via INTRAVENOUS

## 2022-05-30 MED ORDER — 0.9 % SODIUM CHLORIDE (POUR BTL) OPTIME
TOPICAL | Status: DC | PRN
  Administered 2022-05-30: 1000 mL

## 2022-05-30 MED ORDER — ONDANSETRON HCL 4 MG PO TABS: 4.0000 mg | ORAL_TABLET | Freq: Four times a day (QID) | ORAL | Status: DC | PRN

## 2022-05-30 MED ORDER — ALUM & MAG HYDROXIDE-SIMETH 200-200-20 MG/5ML PO SUSP: 30.0000 mL | ORAL | Status: DC | PRN

## 2022-05-30 MED ORDER — PROPOFOL 10 MG/ML IV BOLUS
INTRAVENOUS | Status: AC
  Filled 2022-05-30: qty 20

## 2022-05-30 MED ORDER — PANTOPRAZOLE SODIUM 40 MG PO TBEC
40.0000 mg | DELAYED_RELEASE_TABLET | Freq: Every day | ORAL | Status: DC
  Administered 2022-05-31 – 2022-06-02 (×3): 40 mg via ORAL
  Filled 2022-05-30 (×3): qty 1

## 2022-05-30 MED ORDER — METOCLOPRAMIDE HCL 5 MG/ML IJ SOLN: 5.0000 mg | Freq: Three times a day (TID) | INTRAMUSCULAR | Status: DC | PRN

## 2022-05-30 MED ORDER — HYDROMORPHONE HCL 1 MG/ML IJ SOLN
1.0000 mg | INTRAMUSCULAR | Status: DC | PRN
  Administered 2022-05-30: 1 mg via INTRAVENOUS
  Filled 2022-05-30: qty 1

## 2022-05-30 MED ORDER — BUPIVACAINE IN DEXTROSE 0.75-8.25 % IT SOLN
INTRATHECAL | Status: DC | PRN
  Administered 2022-05-30: 1.6 mL via INTRATHECAL

## 2022-05-30 MED ORDER — ORAL CARE MOUTH RINSE: 15.0000 mL | Freq: Once | OROMUCOSAL | Status: AC

## 2022-05-30 MED ORDER — CEFAZOLIN SODIUM-DEXTROSE 1-4 GM/50ML-% IV SOLN
1.0000 g | Freq: Four times a day (QID) | INTRAVENOUS | Status: AC
  Administered 2022-05-30 (×2): 1 g via INTRAVENOUS
  Filled 2022-05-30 (×2): qty 50

## 2022-05-30 MED ORDER — HYDROMORPHONE HCL 2 MG PO TABS: 2.0000 mg | ORAL_TABLET | ORAL | Status: DC | PRN

## 2022-05-30 MED ORDER — METHOCARBAMOL 500 MG IVPB - SIMPLE MED
INTRAVENOUS | Status: AC
  Filled 2022-05-30: qty 55

## 2022-05-30 MED ORDER — FENTANYL CITRATE (PF) 100 MCG/2ML IJ SOLN
INTRAMUSCULAR | Status: AC
  Filled 2022-05-30: qty 2

## 2022-05-30 MED ORDER — DOCUSATE SODIUM 100 MG PO CAPS
100.0000 mg | ORAL_CAPSULE | Freq: Two times a day (BID) | ORAL | Status: DC
  Administered 2022-05-30 – 2022-06-02 (×6): 100 mg via ORAL
  Filled 2022-05-30 (×6): qty 1

## 2022-05-30 MED ORDER — METHOCARBAMOL 500 MG IVPB - SIMPLE MED
500.0000 mg | Freq: Four times a day (QID) | INTRAVENOUS | Status: DC | PRN
  Administered 2022-05-30: 500 mg via INTRAVENOUS

## 2022-05-30 MED ORDER — AMLODIPINE BESYLATE 5 MG PO TABS
5.0000 mg | ORAL_TABLET | Freq: Every day | ORAL | Status: DC
  Administered 2022-05-30 – 2022-06-02 (×4): 5 mg via ORAL
  Filled 2022-05-30 (×4): qty 1

## 2022-05-30 MED ORDER — PHENOL 1.4 % MT LIQD: 1.0000 | OROMUCOSAL | Status: DC | PRN

## 2022-05-30 MED ORDER — EPHEDRINE SULFATE-NACL 50-0.9 MG/10ML-% IV SOSY
PREFILLED_SYRINGE | INTRAVENOUS | Status: DC | PRN
  Administered 2022-05-30: 5 mg via INTRAVENOUS
  Administered 2022-05-30: 10 mg via INTRAVENOUS
  Administered 2022-05-30 (×2): 5 mg via INTRAVENOUS

## 2022-05-30 MED ORDER — LACTATED RINGERS IV SOLN: INTRAVENOUS | Status: DC

## 2022-05-30 MED ORDER — MIDAZOLAM HCL 2 MG/2ML IJ SOLN
INTRAMUSCULAR | Status: DC | PRN
  Administered 2022-05-30: 2 mg via INTRAVENOUS

## 2022-05-30 SURGICAL SUPPLY — 41 items
APL SKNCLS STERI-STRIP NONHPOA (GAUZE/BANDAGES/DRESSINGS)
BAG COUNTER SPONGE SURGICOUNT (BAG) ×2 IMPLANT
BAG SPEC THK2 15X12 ZIP CLS (MISCELLANEOUS) ×1
BAG SPNG CNTER NS LX DISP (BAG) ×1
BAG ZIPLOCK 12X15 (MISCELLANEOUS) IMPLANT
BENZOIN TINCTURE PRP APPL 2/3 (GAUZE/BANDAGES/DRESSINGS) IMPLANT
BLADE SAW SGTL 18X1.27X75 (BLADE) ×2 IMPLANT
COVER PERINEAL POST (MISCELLANEOUS) ×2 IMPLANT
COVER SURGICAL LIGHT HANDLE (MISCELLANEOUS) ×2 IMPLANT
DRAPE FOOT SWITCH (DRAPES) ×2 IMPLANT
DRAPE STERI IOBAN 125X83 (DRAPES) ×2 IMPLANT
DRAPE U-SHAPE 47X51 STRL (DRAPES) ×4 IMPLANT
DRSG AQUACEL AG ADV 3.5X10 (GAUZE/BANDAGES/DRESSINGS) ×2 IMPLANT
DURAPREP 26ML APPLICATOR (WOUND CARE) ×2 IMPLANT
ELECT REM PT RETURN 15FT ADLT (MISCELLANEOUS) ×2 IMPLANT
GAUZE XEROFORM 1X8 LF (GAUZE/BANDAGES/DRESSINGS) ×2 IMPLANT
GLOVE BIO SURGEON STRL SZ7.5 (GLOVE) ×2 IMPLANT
GLOVE BIOGEL PI IND STRL 8 (GLOVE) ×4 IMPLANT
GLOVE ECLIPSE 8.0 STRL XLNG CF (GLOVE) ×2 IMPLANT
GOWN STRL REUS W/ TWL XL LVL3 (GOWN DISPOSABLE) ×4 IMPLANT
GOWN STRL REUS W/TWL XL LVL3 (GOWN DISPOSABLE) ×2
HANDPIECE INTERPULSE COAX TIP (DISPOSABLE) ×1
HEAD CERAMIC DELTA 36 PLUS 1.5 (Hips) IMPLANT
HOLDER FOLEY CATH W/STRAP (MISCELLANEOUS) ×2 IMPLANT
KIT TURNOVER KIT A (KITS) IMPLANT
LINER NEUTRAL 52X36MM PLUS 4 (Liner) IMPLANT
PACK ANTERIOR HIP CUSTOM (KITS) ×2 IMPLANT
PIN SECTOR W/GRIP ACE CUP 52MM (Hips) IMPLANT
SET HNDPC FAN SPRY TIP SCT (DISPOSABLE) ×2 IMPLANT
STAPLER VISISTAT 35W (STAPLE) IMPLANT
STEM FEM ACTIS STD SZ2 (Stem) IMPLANT
STRIP CLOSURE SKIN 1/2X4 (GAUZE/BANDAGES/DRESSINGS) IMPLANT
SUT ETHIBOND NAB CT1 #1 30IN (SUTURE) ×2 IMPLANT
SUT ETHILON 2 0 PS N (SUTURE) IMPLANT
SUT MNCRL AB 4-0 PS2 18 (SUTURE) IMPLANT
SUT VIC AB 0 CT1 36 (SUTURE) ×2 IMPLANT
SUT VIC AB 1 CT1 36 (SUTURE) ×2 IMPLANT
SUT VIC AB 2-0 CT1 27 (SUTURE) ×2
SUT VIC AB 2-0 CT1 TAPERPNT 27 (SUTURE) ×4 IMPLANT
TRAY FOLEY MTR SLVR 16FR STAT (SET/KITS/TRAYS/PACK) IMPLANT
YANKAUER SUCT BULB TIP NO VENT (SUCTIONS) ×2 IMPLANT

## 2022-05-30 NOTE — TOC Transition Note (Signed)
Transition of Care [facility]) - CM/SW Discharge Note  Patient Details  Name: Victor PAOLINI Sr. MRN: [id number] Date of Birth: 27-Nov-1955  Transition of Care [facility]) CM/SW Contact:  Sherie Don, LCSW Phone Number: 05/30/2022, 3:10 PM  Clinical Narrative: Patient is expected to discharge home after working with PT. CSW met with patient and family to confirm discharge plan and needs. Patient will go home with HHPT, which was prearranged with [place]. Patient will need a rolling walker, which [employer] will deliver to patient's room. TOC signing off.    Final next level of care: Home w Home Health Services Barriers to Discharge: No Barriers Identified  Patient Goals and CMS Choice CMS Medicare.gov Compare Post Acute Care list provided to:: Patient Choice offered to / list presented to : Patient  Discharge Plan and Services Additional resources added to the After Visit Summary for         DME Arranged: Walker rolling DME Agency: [employer] Date DME Agency Contacted: 05/30/22 Representative spoke with at DME Agency: Erasmo Downer HH Arranged: PT [place]: [place] Representative spoke with at [place] in orthopedist's office  Social Determinants of Health ([place]) Interventions SDOH Screenings   Tobacco Use: Low Risk  (05/30/2022)   Readmission Risk Interventions     No data to display

## 2022-05-30 NOTE — Op Note (Signed)
Operative Note  Date of operation: 05/30/2022 Preoperative diagnosis: Right hip primary osteoarthritis Postoperative diagnosis: Same  Procedure: Right direct anterior total hip arthroplasty  Implants: Implant Name Type Inv. Item Serial No. Manufacturer Lot No. LRB No. Used Action  PIN SECTOR W/GRIP ACE CUP 52MM - [id number] Hips PIN SECTOR W/GRIP ACE CUP 52MM  [employer]PU[employer] [id number] Right 1 Implanted  LINER NEUTRAL 52X36MM PLUS 4 - [id number] Liner LINER NEUTRAL 52X36MM PLUS 4  [employer]PUY ORTH[employer] [id number] Right 1 Implanted  STEM FEM ACTIS STD SZ2 - [id number] Stem STEM FEM ACTIS STD SZ2  DEPUY ORTH[employer] [id number] Right 1 Implanted  HEAD CERAMIC DELTA 36 PLUS 1.5 - [id number] Hips HEAD CERAMIC DELTA 36 PLUS 1.5  [employer]PU[employer]TH[employer] [id number] Right 1 Implanted     Surgeon: Lind Guest. Ninfa Linden, MD Assistant: Benita Stabile, PA-C  Anesthesia: Spinal EBL: 300 cc Antibiotics: 2 g IV Ancef Complications: None  Indications: The patient is a 67 year old gentleman well-known to me.  He has well documented debilitating arthritis involving his right hip.  We actually replaced his left hip between 8 and 9 years ago for the same situation.  He is very active.  His right hip pain is daily and it is detrimentally affecting his mobility, his quality of life and his actives daily living to the point he does wish to proceed with a total hip arthroplasty.  The risk and benefits of the surgery discussed in detail and informed consent is obtained.    Procedure description: After informed consent obtained proper right hip was marked the patient was brought to the operating room and set up on a stretcher and spinal anesthesia was obtained.  He was laid in the supine position on the stretcher and a Foley catheter was placed.  Traction boots were placed on both his feet and he was next placed supine on the Hana fracture table with a perineal post in place in both legs and inline skeletal traction devices but no  traction applied.  His right operative hip was assessed radiographically and then it was prepped and draped with DuraPrep and sterile drapes.  A timeout was called and he was then applied as correct patient correct right hip.  An incision was then made just inferior and posterior to the ASIS and carried slightly obliquely down the leg.  Dissection was carried down to the tensor fascia lata muscle and the tensor fascia was then divided longitudinally to proceed with a direct and approach the hip.  Circumflex vessels were identified and cauterized and hip capsule identified and opened up in L-type format.  Cobra retractors placed on the medial lateral femoral neck and a femoral neck cut was made with an oscillating saw just proximal to the lesser trochanter and completed with an osteotome.  A corkscrew guide was placed in the femoral head and the femoral head was removed in's entirety finding a wide area devoid of cartilage.  A bent Hohmann was placed over the medial acetabular rim and remnants of the acetabular labrum and other debris were removed.  Reaming was then initiated from a size 43 reamer and stepwise increments going up to a size 51 reamer with all reamers placed under direct visualization and the last reamer was placed under direct fluoroscopy in order to obtain the depth of reaming, the inclination and anteversion.  The real DePuy sector GRIPTION acetabular component size 52 was then placed without difficulty followed by a 36+4 polythene liner.  Attention was then turned to the femur.  With the right  leg externally rotated to 120 degrees, extended and adducted a Mueller retractor was placed medially and a Hohmann retractor was placed behind the greater trochanter.  A box cutting osteotome was used in the femoral canal.  The lateral joint capsule was released.  Broaching was then initiated from a size 0 broach and stepwise increments: To just a size 2 broach.  He is a petite individual.  With a size 2  broach in place we trialed a standard offset femoral neck and a 36+1.5 trial hip ball.  This reduced in the acetabulum we replaced the range of motion, offset, leg length and stability assessed mechanically and radiographically.  The trial components were then dislocated and removed.  We then placed the real Actis femoral component from DePuy size 2 with standard offset and the real 36+1.5 ceramic head ball.  Again this was reduced in the acetabulum we are pleased with clinical and radiographic assessment.  The soft tissue was then irrigated with normal saline solution.  The joint capsule was closed interrupted #1 Ethibond suture followed by #1 Vicryl close the tensor fascia.  0 Vicryl was used to close the deep tissue and 2-0 Vicryl is used to close subcutaneous tissue.  The skin was closed with staples.  An Aquacel dressing was applied.  The patient was taken off on table and taken to recovery in stable addition.  Benita Stabile, PA-C did assist during the entire case and beginning to end and his assistance was crucial and medically medically necessary for soft tissue retraction and management, helping guide implant placement and a layered closure of the wound.

## 2022-05-30 NOTE — Interval H&P Note (Signed)
History and Physical Interval Note: The patient understands that he is here today for a right total hip replacement to treat his severe right hip arthritis.  There has been no acute or interval changes in medical status.  See H&P.  The risks and benefits of surgery been explained in detail and informed consent is obtained.  The right operative hip has been marked.  05/30/2022 9:41 AM  Victor Meuse Sr.  has presented today for surgery, with the diagnosis of osteoarthritis right hip.  The various methods of treatment have been discussed with the patient and family. After consideration of risks, benefits and other options for treatment, the patient has consented to  Procedure(s): RIGHT TOTAL HIP ARTHROPLASTY ANTERIOR APPROACH (Right) as a surgical intervention.  The patient's history has been reviewed, patient examined, no change in status, stable for surgery.  I have reviewed the patient's chart and labs.  Questions were answered to the patient's satisfaction.     Mcarthur Rossetti

## 2022-05-30 NOTE — Transfer of Care (Signed)
Immediate Anesthesia Transfer of Care Note  Patient: Victor Meuse Sr.  Procedure(s) Performed: RIGHT TOTAL HIP ARTHROPLASTY ANTERIOR APPROACH (Right: Hip)  Patient Location: PACU  Anesthesia Type:Spinal  Level of Consciousness: drowsy and responds to stimulation  Airway & Oxygen Therapy: Patient Spontanous Breathing and Patient connected to face mask oxygen  Post-op Assessment: Report given to RN and Post -op Vital signs reviewed and stable  Post vital signs: Reviewed and stable  Last Vitals:  Vitals Value Taken Time  BP 112/60 05/30/22 1224  Temp    Pulse 61 05/30/22 1226  Resp 20 05/30/22 1226  SpO2 100 % 05/30/22 1226  Vitals shown include unvalidated device data.  Last Pain:  Vitals:   05/30/22 0843  TempSrc: Oral  PainSc: 5       Patients Stated Pain Goal: 4 ([id number])  Complications: No notable events documented.

## 2022-05-30 NOTE — Anesthesia Procedure Notes (Signed)
Procedure Name: MAC Date/Time: 05/30/2022 10:57 AM  Performed by: Eben Burow, CRNAPre-anesthesia Checklist: Patient identified, Emergency Drugs available, Suction available, Patient being monitored and Timeout performed Oxygen Delivery Method: Simple face mask Placement Confirmation: positive ETCO2

## 2022-05-30 NOTE — Anesthesia Preprocedure Evaluation (Addendum)
Anesthesia Evaluation  Patient identified by MRN, date of birth, ID band Patient awake    Reviewed: Allergy & Precautions, NPO status , Patient's Chart, lab work & pertinent test results  Airway Mallampati: II  TM Distance: >3 FB Neck ROM: Full    Dental  (+) Edentulous Upper, Upper Dentures, Partial Lower, Dental Advisory Given   Pulmonary neg pulmonary ROS   Pulmonary exam normal breath sounds clear to auscultation       Cardiovascular hypertension, Pt. on medications Normal cardiovascular exam Rhythm:Regular Rate:Normal     Neuro/Psych negative neurological ROS  negative psych ROS   GI/Hepatic negative GI ROS, Neg liver ROS,,,  Endo/Other  negative endocrine ROS    Renal/GU negative Renal ROS  negative genitourinary   Musculoskeletal  (+) Arthritis ,    Abdominal   Peds  Hematology negative hematology ROS (+)   Anesthesia Other Findings   Reproductive/Obstetrics                             Anesthesia Physical Anesthesia Plan  ASA: 2  Anesthesia Plan: Spinal   Post-op Pain Management: Tylenol PO (pre-op)*   Induction:   PONV Risk Score and Plan: 1 and Treatment may vary due to age or medical condition, Midazolam, Dexamethasone, Propofol infusion and Ondansetron  Airway Management Planned: Natural Airway  Additional Equipment:   Intra-op Plan:   Post-operative Plan:   Informed Consent: I have reviewed the patients History and Physical, chart, labs and discussed the procedure including the risks, benefits and alternatives for the proposed anesthesia with the patient or authorized representative who has indicated his/her understanding and acceptance.     Dental advisory given  Plan Discussed with: CRNA  Anesthesia Plan Comments:        Anesthesia Quick Evaluation

## 2022-05-30 NOTE — Anesthesia Procedure Notes (Signed)
Spinal  Patient location during procedure: OR Start time: 05/30/2022 11:06 AM Reason for block: surgical anesthesia Staffing Performed: resident/CRNA  Anesthesiologist: Freddrick March, MD Resident/CRNA: Eben Burow, CRNA Performed by: Eben Burow, CRNA Authorized by: Freddrick March, MD   Preanesthetic Checklist Completed: patient identified, IV checked, site marked, risks and benefits discussed, surgical consent, monitors and equipment checked, pre-op evaluation and timeout performed Spinal Block Patient position: sitting Prep: DuraPrep and site prepped and draped Patient monitoring: heart rate, cardiac monitor, continuous pulse ox and blood pressure Approach: midline Location: L3-4 Injection technique: single-shot Needle Needle type: Pencan  Needle gauge: 24 G Needle length: 10 cm Assessment Events: CSF return Additional Notes Pt placed in sitting position, spinal kit expiration date checked and verified, + CSF, - heme, pt tolerated well. Dr Lanetta Inch present and supervising throughout SAB placement. Adequate sensory level.

## 2022-05-30 NOTE — Anesthesia Postprocedure Evaluation (Signed)
Anesthesia Post Note  Patient: ALEXIUS BOONSTRA Sr.  Procedure(s) Performed: RIGHT TOTAL HIP ARTHROPLASTY ANTERIOR APPROACH (Right: Hip)     Patient location during evaluation: PACU Anesthesia Type: Spinal Level of consciousness: oriented and awake and alert Pain management: pain level controlled Vital Signs Assessment: post-procedure vital signs reviewed and stable Respiratory status: spontaneous breathing, respiratory function stable and patient connected to nasal cannula oxygen Cardiovascular status: blood pressure returned to baseline and stable Postop Assessment: no headache, no backache and no apparent nausea or vomiting Anesthetic complications: no  No notable events documented.  Last Vitals:  Vitals:   05/30/22 1345 05/30/22 1418  BP: (!) 160/85 (!) 169/86  Pulse: (!) 52 (!) 51  Resp: 15 16  Temp:  36.7 C  SpO2: 100% 100%    Last Pain:  Vitals:   05/30/22 1418  TempSrc: Oral  PainSc:                  Ece C[place] L Kavari Parrillo

## 2022-05-30 NOTE — Evaluation (Signed)
Physical Therapy Evaluation Patient Details Name: Victor KUTCHER Sr. MRN: [id number] DOB: 1956/04/03 Today's Date: 05/30/2022  History of Present Illness  67 yo male presents to therapy s/p R THA on 05/30/2022 due to failure of conservative measures.  Pt has PMH including but not limited to HTN, insomnia, L THA (2015)  Clinical Impression  [name]. is a 67 y.o. male POD 0 s/p R THA. Patient reports mod I at [facility] level with mobility at baseline. Patient is now limited by functional impairments (see PT problem list below) and requires cues and increased time with min guard  for bed mobility and min guard for transfers. Patient was able to ambulate 14 feet with RW and min guard level of assist. Gait assessment limited due to pt S and S of orthostatic hypotension and reports of dizziness.  Orthostatic hypotension not formally tested and PT made nurse aware of pt physiological response with functional mobility tasks. Patient instructed in exercise to facilitate ROM and circulation to manage edema. Patient will benefit from continued skilled PT interventions to address impairments and progress towards PLOF. Acute PT will follow to progress mobility and stair training in preparation for safe discharge home.        Recommendations for follow up therapy are one component of a multi-disciplinary discharge planning process, led by the attending physician.  Recommendations may be updated based on patient status, additional functional criteria and insurance authorization.  Follow Up Recommendations Home health PT      Assistance Recommended at Discharge Frequent or constant Supervision/Assistance  Patient can return home with the following  A little help with walking and/or transfers;A little help with bathing/dressing/bathroom;Assistance with cooking/housework;Assist for transportation;Help with stairs or ramp for entrance    Equipment Recommendations Rolling walker (2 wheels);BSC/3in1;Other  (comment) (RW delivered to room  pt is requesting [facility] and indicated can borrow from a family friend)  Recommendations for Other Services       Functional Status Assessment Patient has had a recent decline in their functional status and demonstrates the ability to make significant improvements in function in a reasonable and predictable amount of time.     Precautions / Restrictions Precautions Precautions: Anterior Hip;Fall;Other (comment) (pt demonstrated S and S of orthostatic hypotension with reports of dizziness and evidence of diaphoresis) Restrictions Weight Bearing Restrictions: No      Mobility  Bed Mobility Overal bed mobility: Needs Assistance Bed Mobility: Supine to Sit     Supine to sit: Min guard     General bed mobility comments: cues increased time and HOB elevated    Transfers Overall transfer level: Needs assistance Equipment used: Rolling walker (2 wheels) Transfers: Sit to/from Stand Sit to Stand: Min guard           General transfer comment: cues for proper UE placement    Ambulation/Gait Ambulation/Gait assistance: Min guard Gait Distance (Feet): 14 Feet Assistive device: Rolling walker (2 wheels) Gait Pattern/deviations: Step-to pattern, Antalgic Gait velocity: decreased        Stairs            Wheelchair Mobility    Modified Rankin (Stroke Patients Only)       Balance Overall balance assessment: Needs assistance Sitting-balance support: Feet supported Sitting balance-Leahy Scale: Fair     Standing balance support: Reliant on assistive device for balance Standing balance-Leahy Scale: Poor  Pertinent Vitals/Pain Pain Assessment Pain Assessment: 0-10 Pain Score: 7  Pain Location: R hip Pain Descriptors / Indicators: Discomfort, Guarding Pain Intervention(s): Limited activity within patient's tolerance, Monitored during session, Repositioned, Ice applied, Premedicated before  session    Home Living Family/patient expects to be discharged to:: Private residence Living Arrangements: Alone Available Help at Discharge: Family;Other (Comment) (pt is to transition to daughters home and home set up to follow is refective of d/c location) Type of Home: Apartment Home Access: Stairs to enter Entrance Stairs-Rails: Right Entrance Stairs-Number of Steps: [place]: One level Home Equipment: Cane - quad Additional Comments: RW delivered prior to eval and adjusted    Prior Function Prior Level of Function : Independent/Modified Independent             Mobility Comments: pt reports living at home alone and performing ADLs, self care at [facility] level and using public transportaion       Hand Dominance        Extremity/Trunk Assessment        Lower Extremity Assessment Lower Extremity Assessment: RLE deficits/detail RLE Deficits / Details: ankle DF/PF 5/5 RLE Sensation: decreased light touch       Communication   Communication: No difficulties  Cognition Arousal/Alertness: Awake/alert Behavior During Therapy: WFL for tasks assessed/performed Overall Cognitive Status: Within Functional Limits for tasks assessed                                          General Comments General comments (skin integrity, edema, etc.): son present for a portion of eval and PT spoke with daughter on phone per apartment set up and Dr. Ninfa Linden arrived during intervention    Exercises Total Joint Exercises Ankle Circles/Pumps: AROM, Both, 20 reps   Assessment/Plan    PT Assessment Patient needs continued PT services  PT Problem List Decreased strength;Decreased range of motion;Decreased activity tolerance;Decreased mobility;Decreased balance;Decreased knowledge of use of DME;Pain       PT Treatment Interventions DME instruction;Gait training;Stair training;Functional mobility training;Therapeutic activities;Therapeutic exercise;Balance  training;Neuromuscular re-education;Patient/family education;Modalities    PT Goals (Current goals can be found in the Care Plan section)  Acute Rehab PT Goals Patient Stated Goal: to be able to move and return to martial arts training PT Goal Formulation: With patient Time For Goal Achievement: 06/13/22    Frequency 7X/week     Co-evaluation               AM-PAC PT "6 Clicks" Mobility  Outcome Measure Help needed turning from your back to your side while in a flat bed without using bedrails?: A Little Help needed moving from lying on your back to sitting on the side of a flat bed without using bedrails?: A Little Help needed moving to and from a bed to a chair (including a wheelchair)?: A Little Help needed standing up from a chair using your arms (e.g., wheelchair or bedside chair)?: A Little Help needed to walk in hospital room?: A Little Help needed climbing 3-5 steps with a railing? : Total 6 Click Score: 16    End of Session Equipment Utilized During Treatment: Gait belt Activity Tolerance: No increased pain;Treatment limited secondary to medical complications (Comment) Patient left: in chair;with chair alarm set;with call bell/phone within reach Nurse Communication: Mobility status;Other (comment) (pt S and S indicatve of orthostatic hypotension) PT Visit Diagnosis: Unsteadiness on feet (R26.81);Other  abnormalities of gait and mobility (R26.89);Muscle weakness (generalized) (M62.81);Difficulty in walking, not elsewhere classified (R26.2);Pain Pain - Right/Left: Right Pain - part of body: Hip    Time: [id number] PT Time Calculation (min) (ACUTE ONLY): 46 min   Charges:   PT Evaluation $PT Eval Low Complexity: 1 Low PT Treatments $Gait Training: 8-22 mins $Therapeutic Activity: 8-22 mins       Baird Lyons, PT   [place]air Patter 05/30/2022, 5:45 PM

## 2022-05-31 DIAGNOSIS — M1611 Unilateral primary osteoarthritis, right hip: Secondary | ICD-10-CM | POA: Diagnosis not present

## 2022-05-31 LAB — BASIC METABOLIC PANEL
Anion gap: 8 (ref 5–15)
BUN: 13 mg/dL (ref 8–23)
CO2: 26 mmol/L (ref 22–32)
Calcium: 8.6 mg/dL — ABNORMAL LOW (ref 8.9–10.3)
Chloride: 101 mmol/L (ref 98–111)
Creatinine, Ser: 0.91 mg/dL (ref 0.61–1.24)
GFR, Estimated: >60 mL/min (ref >60)
Glucose, Bld: 125 mg/dL — ABNORMAL HIGH (ref 70–99)
Potassium: 4.1 mmol/L (ref 3.5–5.1)
Sodium: 135 mmol/L (ref 135–145)

## 2022-05-31 LAB — CBC
HCT: 33.1 % — ABNORMAL LOW (ref 39.0–52.0)
Hemoglobin: 11.3 g/dL — ABNORMAL LOW (ref 13.0–17.0)
MCH: 31.2 pg (ref 26.0–34.0)
MCHC: 34.1 g/dL (ref 30.0–36.0)
MCV: 91.4 fL (ref 80.0–100.0)
Platelets: 227 10*3/uL (ref 150–400)
RBC: 3.62 MIL/uL — ABNORMAL LOW (ref 4.22–5.81)
RDW: 12.6 % (ref 11.5–15.5)
WBC: 13.6 10*3/uL — ABNORMAL HIGH (ref 4.0–10.5)
nRBC: 0 % (ref 0.0–0.2)

## 2022-05-31 MED ORDER — METHOCARBAMOL 500 MG PO TABS: 500.0000 mg | ORAL_TABLET | Freq: Four times a day (QID) | ORAL | 1 refills | Status: DC | PRN

## 2022-05-31 MED ORDER — OXYCODONE HCL 5 MG PO TABS: 5.0000 mg | ORAL_TABLET | ORAL | 0 refills | Status: DC | PRN

## 2022-05-31 MED ORDER — ASPIRIN 81 MG PO CHEW: 81.0000 mg | CHEWABLE_TABLET | Freq: Two times a day (BID) | ORAL | 0 refills | Status: AC

## 2022-05-31 NOTE — Progress Notes (Signed)
Patient ID: Victor Meuse Sr., male   DOB: Aug 05, 1955, 67 y.o.   MRN: [id number] Therapy is been working with the patient now.  They are working on getting up and mobilizing.  He did mobilize some yesterday.  He is let me know from the all and said that he needs to stay until Monday.  I think this may be also related to his status in terms of the fact that he takes the bus to most places and mobilizes slowly with a cane prior to surgery yesterday.  His vital signs are stable and his labs are stable.  His right operative hip is stable.  He will continue to work with PT while he is here.  I guess the plan will be keeping him here until Monday for mobility and social purposes.  If utilization review needs this patient switched to an inpatient admission, they have my permission to do this is a voice order.

## 2022-05-31 NOTE — Progress Notes (Signed)
Physical Therapy Treatment Patient Details Name: Victor Santiago Sr. MRN: [id number] DOB: 1956/02/26 Today's Date: 05/31/2022   History of Present Illness 67 yo male presents to therapy s/p R THA on 05/30/2022 due to failure of conservative measures.  Pt has PMH including but not limited to HTN, insomnia, L THA (2015)    PT Comments    Pt ambulated in hallway and then performed LE exercises in bed.  Pt declined staying OOB in recliner.  Pt anticipates d/c to daughter's home Monday.  Recommendations for follow up therapy are one component of a multi-disciplinary discharge planning process, led by the attending physician.  Recommendations may be updated based on patient status, additional functional criteria and insurance authorization.  Follow Up Recommendations  Follow physician's recommendations for discharge plan and follow up therapies     Assistance Recommended at Discharge Intermittent Supervision/Assistance  Patient can return home with the following Assistance with cooking/housework;Assist for transportation;Help with stairs or ramp for entrance   Equipment Recommendations  Rolling walker (2 wheels)    Recommendations for Other Services       Precautions / Restrictions Precautions Precautions: Fall Restrictions Weight Bearing Restrictions: No Other Position/Activity Restrictions: WBAT     Mobility  Bed Mobility Overal bed mobility: Needs Assistance Bed Mobility: Supine to Sit, Sit to Supine     Supine to sit: Min guard Sit to supine: Min guard   General bed mobility comments: increased time and HOB elevated, required gait belt to self assist R LE onto bed    Transfers Overall transfer level: Needs assistance Equipment used: Rolling walker (2 wheels) Transfers: Sit to/from Stand Sit to Stand: Min guard           General transfer comment: cues for UE and LE positioning    Ambulation/Gait Ambulation/Gait assistance: Min guard Gait Distance (Feet): 160  Feet Assistive device: Rolling walker (2 wheels) Gait Pattern/deviations: Step-to pattern, Antalgic, Trunk flexed, Decreased stance time - right Gait velocity: decreased     General Gait Details: verbal cues for sequence, RW positioning, step length, posture   Stairs             Wheelchair Mobility    Modified Rankin (Stroke Patients Only)       Balance                                            Cognition Arousal/Alertness: Awake/alert Behavior During Therapy: WFL for tasks assessed/performed Overall Cognitive Status: Within Functional Limits for tasks assessed                                          Exercises Total Joint Exercises Ankle Circles/Pumps: AROM, Both, 20 reps Quad Sets: AROM, Right, 10 reps Short Arc Quad: AROM, Seated, Right, 10 reps Heel Slides: AAROM, Right, 10 reps Hip ABduction/ADduction: AAROM, Right, 10 reps    General Comments        Pertinent Vitals/Pain Pain Assessment Pain Assessment: 0-10 Pain Score: 5  Pain Location: R hip Pain Descriptors / Indicators: Discomfort, Guarding, Sore Pain Intervention(s): Repositioned, Premedicated before session, Monitored during session, Ice applied    Home Living  Prior Function            PT Goals (current goals can now be found in the care plan section) Progress towards PT goals: Progressing toward goals    Frequency    7X/week      PT Plan Current plan remains appropriate    Co-evaluation              AM-PAC PT "6 Clicks" Mobility   Outcome Measure  Help needed turning from your back to your side while in a flat bed without using bedrails?: A Little Help needed moving from lying on your back to sitting on the side of a flat bed without using bedrails?: A Little Help needed moving to and from a bed to a chair (including a wheelchair)?: A Little Help needed standing up from a chair using your arms  (e.g., wheelchair or bedside chair)?: A Little Help needed to walk in hospital room?: A Little Help needed climbing 3-5 steps with a railing? : A Lot 6 Click Score: 17    End of Session Equipment Utilized During Treatment: Gait belt Activity Tolerance: Patient tolerated treatment well Patient left: in bed;with call bell/phone within reach;with bed alarm set;with SCD's reapplied   PT Visit Diagnosis: Other abnormalities of gait and mobility (R26.89)     Time: [id number] PT Time Calculation (min) (ACUTE ONLY): 24 min  Charges:  $Gait Training: 8-22 mins $Therapeutic Exercise: 8-22 mins                    Jannette Spanner PT, DPT Physical Therapist Acute Rehabilitation Services Preferred contact method: Secure Chat Weekend Pager Only: [phone] Office: [place] 05/31/2022, 3:34 PM

## 2022-05-31 NOTE — Progress Notes (Signed)
Physical Therapy Treatment Patient Details Name: Victor GURKIN Sr. MRN: [id number] DOB: Aug 11, 1955 Today's Date: 05/31/2022   History of Present Illness 67 yo male presents to therapy s/p R THA on 05/30/2022 due to failure of conservative measures.  Pt has PMH including but not limited to HTN, insomnia, L THA (2015)    PT Comments    Pt reports getting no sleep last night due to pain. Pt more tolerable at this time.  Pt assisted with ambulating in hallway.  Pt anticipates d/c to daughters home and has stairs to enter her home.    Recommendations for follow up therapy are one component of a multi-disciplinary discharge planning process, led by the attending physician.  Recommendations may be updated based on patient status, additional functional criteria and insurance authorization.  Follow Up Recommendations  Follow physician's recommendations for discharge plan and follow up therapies     Assistance Recommended at Discharge Frequent or constant Supervision/Assistance  Patient can return home with the following A little help with walking and/or transfers;A little help with bathing/dressing/bathroom;Assistance with cooking/housework;Assist for transportation;Help with stairs or ramp for entrance   Equipment Recommendations  Rolling walker (2 wheels)    Recommendations for Other Services       Precautions / Restrictions Precautions Precautions: Fall Restrictions Weight Bearing Restrictions: No Other Position/Activity Restrictions: WBAT     Mobility  Bed Mobility Overal bed mobility: Needs Assistance Bed Mobility: Supine to Sit, Sit to Supine     Supine to sit: Min guard Sit to supine: Min guard   General bed mobility comments: cues increased time and HOB elevated    Transfers Overall transfer level: Needs assistance Equipment used: Rolling walker (2 wheels) Transfers: Sit to/from Stand Sit to Stand: Min guard           General transfer comment: cues for UE and LE  positioning    Ambulation/Gait Ambulation/Gait assistance: Min guard Gait Distance (Feet): 120 Feet Assistive device: Rolling walker (2 wheels) Gait Pattern/deviations: Step-to pattern, Antalgic, Trunk flexed, Decreased stance time - right Gait velocity: decreased     General Gait Details: verbal cues for sequence, RW positioning, step length, posture   Stairs             Wheelchair Mobility    Modified Rankin (Stroke Patients Only)       Balance                                            Cognition Arousal/Alertness: Awake/alert Behavior During Therapy: WFL for tasks assessed/performed Overall Cognitive Status: Within Functional Limits for tasks assessed                                          Exercises      General Comments        Pertinent Vitals/Pain Pain Assessment Pain Assessment: 0-10 Pain Score: 5  Pain Location: R hip Pain Descriptors / Indicators: Discomfort, Guarding, Sore Pain Intervention(s): Repositioned, Monitored during session, Premedicated before session    Home Living                          Prior Function            PT Goals (current goals can now  be found in the care plan section) Progress towards PT goals: Progressing toward goals    Frequency    7X/week      PT Plan Current plan remains appropriate    Co-evaluation              AM-PAC PT "6 Clicks" Mobility   Outcome Measure  Help needed turning from your back to your side while in a flat bed without using bedrails?: A Little Help needed moving from lying on your back to sitting on the side of a flat bed without using bedrails?: A Little Help needed moving to and from a bed to a chair (including a wheelchair)?: A Little Help needed standing up from a chair using your arms (e.g., wheelchair or bedside chair)?: A Little Help needed to walk in hospital room?: A Little Help needed climbing 3-5 steps with a  railing? : A Lot 6 Click Score: 17    End of Session Equipment Utilized During Treatment: Gait belt Activity Tolerance: Patient tolerated treatment well Patient left: in bed;with call bell/phone within reach;with bed alarm set   PT Visit Diagnosis: Other abnormalities of gait and mobility (R26.89)     [id number] PT Time Calculation (min) (ACUTE ONLY): 18 min  Charges:  $Gait Training: 8-22 mins           Arlyce Dice, DPT Physical Therapist Acute Rehabilitation Services Preferred contact method: Secure Chat Weekend Pager Only: [phone] Office: [phone]     [name] 05/31/2022, 12:56 PM

## 2022-05-31 NOTE — Plan of Care (Signed)

## 2022-05-31 NOTE — Discharge Instructions (Signed)

## 2022-06-01 DIAGNOSIS — M1611 Unilateral primary osteoarthritis, right hip: Secondary | ICD-10-CM | POA: Diagnosis not present

## 2022-06-01 NOTE — Plan of Care (Signed)
  Problem: Education: Goal: Knowledge of the prescribed therapeutic regimen will improve Outcome: Progressing   Problem: Activity: Goal: Ability to avoid complications of mobility impairment will improve Outcome: Progressing   Problem: Pain Management: Goal: Pain level will decrease with appropriate interventions Outcome: Progressing   

## 2022-06-01 NOTE — Progress Notes (Signed)
Physical Therapy Treatment Patient Details Name: Victor GRZYB Sr. MRN: [id number] DOB: March 21, 1956 Today's Date: 06/01/2022   History of Present Illness 67 yo male presents to therapy s/p R THA on 05/30/2022 due to failure of conservative measures.  Pt has PMH including but not limited to HTN, insomnia, L THA (2015)    PT Comments    Pt reports pain and stiffness this morning and initially had difficulty advancing Rt LE.  Pt ambulated shorter distance in hallway and practiced stair technique.    Recommendations for follow up therapy are one component of a multi-disciplinary discharge planning process, led by the attending physician.  Recommendations may be updated based on patient status, additional functional criteria and insurance authorization.  Follow Up Recommendations  Follow physician's recommendations for discharge plan and follow up therapies     Assistance Recommended at Discharge Intermittent Supervision/Assistance  Patient can return home with the following Assistance with cooking/housework;Assist for transportation;Help with stairs or ramp for entrance   Equipment Recommendations  Rolling walker (2 wheels)    Recommendations for Other Services       Precautions / Restrictions Precautions Precautions: Fall Restrictions Other Position/Activity Restrictions: WBAT     Mobility  Bed Mobility Overal bed mobility: Needs Assistance Bed Mobility: Supine to Sit, Sit to Supine     Supine to sit: Supervision Sit to supine: Supervision   General bed mobility comments: increased time and HOB elevated    Transfers Overall transfer level: Needs assistance Equipment used: Rolling walker (2 wheels) Transfers: Sit to/from Stand Sit to Stand: Min guard           General transfer comment: cues for UE and LE positioning    Ambulation/Gait Ambulation/Gait assistance: Min guard Gait Distance (Feet): 60 Feet Assistive device: Rolling walker (2 wheels) Gait  Pattern/deviations: Step-to pattern, Antalgic, Trunk flexed, Decreased stance time - right Gait velocity: decreased     General Gait Details: verbal cues for sequence, RW positioning, step length, posture; increased difficulty advancing Rt LE to start so pt used UEs for a few feet   Stairs Stairs: Yes Stairs assistance: Min guard Stair Management: Step to pattern, Sideways, One rail Right Number of Stairs: 3 General stair comments: verbal cues for safety and sequence   Wheelchair Mobility    Modified Rankin (Stroke Patients Only)       Balance                                            Cognition Arousal/Alertness: Awake/alert Behavior During Therapy: WFL for tasks assessed/performed Overall Cognitive Status: Within Functional Limits for tasks assessed                                          Exercises      General Comments        Pertinent Vitals/Pain Pain Assessment Pain Assessment: 0-10 Pain Score: 6  Pain Location: R hip Pain Descriptors / Indicators: Discomfort, Guarding, Sore Pain Intervention(s): Premedicated before session, Repositioned, Monitored during session    Home Living                          Prior Function            PT Goals (current goals  can now be found in the care plan section) Progress towards PT goals: Progressing toward goals    Frequency    7X/week      PT Plan Current plan remains appropriate    Co-evaluation              AM-PAC PT "6 Clicks" Mobility   Outcome Measure  Help needed turning from your back to your side while in a flat bed without using bedrails?: A Little Help needed moving from lying on your back to sitting on the side of a flat bed without using bedrails?: A Little Help needed moving to and from a bed to a chair (including a wheelchair)?: A Little Help needed standing up from a chair using your arms (e.g., wheelchair or bedside chair)?: A  Little Help needed to walk in hospital room?: A Little Help needed climbing 3-5 steps with a railing? : A Little 6 Click Score: 18    End of Session Equipment Utilized During Treatment: Gait belt Activity Tolerance: Patient tolerated treatment well Patient left: in bed;with call bell/phone within reach;with bed alarm set   PT Visit Diagnosis: Other abnormalities of gait and mobility (R26.89)     [id number] PT Time Calculation (min) (ACUTE ONLY): 22 min  Charges:  $Gait Training: 8-22 mins                    Arlyce Dice, DPT Physical Therapist Acute Rehabilitation Services Preferred contact method: Secure Chat Weekend Pager Only: [phone] Office: [place] 06/01/2022, 4:22 PM

## 2022-06-01 NOTE — Progress Notes (Signed)
Physical Therapy Treatment Patient Details Name: Victor Santiago. MRN: [id number] DOB: Feb 18, 1956 Today's Date: 06/01/2022   History of Present Illness 67 yo male presents to therapy s/p R THA on 05/30/2022 due to failure of conservative measures.  Pt has PMH including but not limited to HTN, insomnia, L THA (2015)    PT Comments    Pt ambulated in hallway improved distance and performed stairs again this afternoon.  Pt's mobility and pain appears much better compared to this morning.  Pt anticipates d/c home tomorrow.  Pt provided with HEP and gait belt and encouraged to perform supine exercises prior to bedtime.     Recommendations for follow up therapy are one component of a multi-disciplinary discharge planning process, led by the attending physician.  Recommendations may be updated based on patient status, additional functional criteria and insurance authorization.  Follow Up Recommendations  Follow physician's recommendations for discharge plan and follow up therapies     Assistance Recommended at Discharge Intermittent Supervision/Assistance  Patient can return home with the following Assistance with cooking/housework;Assist for transportation;Help with stairs or ramp for entrance   Equipment Recommendations  Rolling walker (2 wheels)    Recommendations for Other Services       Precautions / Restrictions Precautions Precautions: Fall Restrictions Other Position/Activity Restrictions: WBAT     Mobility  Bed Mobility Overal bed mobility: Needs Assistance Bed Mobility: Supine to Sit, Sit to Supine     Supine to sit: Supervision Sit to supine: Supervision   General bed mobility comments: increased time and HOB elevated    Transfers Overall transfer level: Needs assistance Equipment used: Rolling walker (2 wheels) Transfers: Sit to/from Stand Sit to Stand: Min guard           General transfer comment: cues for UE and LE positioning     Ambulation/Gait Ambulation/Gait assistance: Min guard Gait Distance (Feet): 280 Feet Assistive device: Rolling walker (2 wheels) Gait Pattern/deviations: Step-to pattern, Antalgic, Trunk flexed, Decreased stance time - right Gait velocity: decreased     General Gait Details: verbal cues for sequence, RW positioning, step length, posture   Stairs Stairs: Yes Stairs assistance: Min guard Stair Management: Step to pattern, Sideways, One rail Right Number of Stairs: 3 General stair comments: verbal cues for safety and sequence, performed twice   Wheelchair Mobility    Modified Rankin (Stroke Patients Only)       Balance                                            Cognition Arousal/Alertness: Awake/alert Behavior During Therapy: WFL for tasks assessed/performed Overall Cognitive Status: Within Functional Limits for tasks assessed                                          Exercises      General Comments        Pertinent Vitals/Pain Pain Assessment Pain Assessment: 0-10 Pain Score: 5  Pain Location: R hip Pain Descriptors / Indicators: Discomfort, Guarding, Sore Pain Intervention(s): Monitored during session, Repositioned    Home Living                          Prior Function  PT Goals (current goals can now be found in the care plan section) Progress towards PT goals: Progressing toward goals    Frequency    7X/week      PT Plan Current plan remains appropriate    Co-evaluation              AM-PAC PT "6 Clicks" Mobility   Outcome Measure  Help needed turning from your back to your side while in a flat bed without using bedrails?: A Little Help needed moving from lying on your back to sitting on the side of a flat bed without using bedrails?: A Little Help needed moving to and from a bed to a chair (including a wheelchair)?: A Little Help needed standing up from a chair using your  arms (e.g., wheelchair or bedside chair)?: A Little Help needed to walk in hospital room?: A Little Help needed climbing 3-5 steps with a railing? : A Little 6 Click Score: 18    End of Session Equipment Utilized During Treatment: Gait belt Activity Tolerance: Patient tolerated treatment well Patient left: in bed;with call bell/phone within reach;with bed alarm set   PT Visit Diagnosis: Other abnormalities of gait and mobility (R26.89)     Time: [id number] PT Time Calculation (min) (ACUTE ONLY): 28 min  Charges:  $Gait Training: 23-37 mins                     Jannette Spanner PT, DPT Physical Therapist Acute Rehabilitation Services Preferred contact method: Secure Chat Weekend Pager Only: [phone] Office: [phone]    [name] 06/01/2022, 4:25 PM

## 2022-06-01 NOTE — Progress Notes (Signed)
Patient ID: Victor Meuse Sr., male   DOB: Apr 23, 1955, 67 y.o.   MRN: [id number] Victor Santiago is medically stable overnight.  He continues to work and progress his mobility.  His pain is well-controlled on today's visit.  He is tolerating a diet well.  At this time he is planning on discharge home 2/26.

## 2022-06-02 ENCOUNTER — Telehealth: Payer: Self-pay | Admitting: Orthopaedic Surgery

## 2022-06-02 ENCOUNTER — Encounter (HOSPITAL_COMMUNITY): Payer: Self-pay | Admitting: Orthopaedic Surgery

## 2022-06-02 ENCOUNTER — Other Ambulatory Visit: Payer: Self-pay | Admitting: Orthopaedic Surgery

## 2022-06-02 DIAGNOSIS — M1611 Unilateral primary osteoarthritis, right hip: Secondary | ICD-10-CM | POA: Diagnosis not present

## 2022-06-02 MED ORDER — OXYCODONE HCL 5 MG PO TABS: 5.0000 mg | ORAL_TABLET | Freq: Four times a day (QID) | ORAL | 0 refills | Status: DC | PRN

## 2022-06-02 NOTE — Telephone Encounter (Signed)
Can you change directions to 1-2 every 6-8 hours please

## 2022-06-02 NOTE — Plan of Care (Signed)
  Problem: Education: Goal: Knowledge of the prescribed therapeutic regimen will improve Outcome: Progressing Goal: Understanding of discharge needs will improve Outcome: Progressing   Problem: Activity: Goal: Ability to avoid complications of mobility impairment will improve Outcome: Progressing

## 2022-06-02 NOTE — Plan of Care (Signed)
  Problem: Education: Goal: Knowledge of the prescribed therapeutic regimen will improve 06/02/2022 1035 by Lise Auer, LPN Outcome: Adequate for Discharge 06/02/2022 0753 by Lise Auer, LPN Outcome: Progressing Goal: Understanding of discharge needs will improve 06/02/2022 1035 by Lise Auer, LPN Outcome: Adequate for Discharge 06/02/2022 0753 by Lise Auer, LPN Outcome: Progressing Goal: Individualized Educational Video(s) Outcome: Adequate for Discharge   Problem: Activity: Goal: Ability to avoid complications of mobility impairment will improve 06/02/2022 1035 by Lise Auer, LPN Outcome: Adequate for Discharge 06/02/2022 0753 by Lise Auer, LPN Outcome: Progressing Goal: Ability to tolerate increased activity will improve Outcome: Adequate for Discharge   Problem: Clinical Measurements: Goal: Postoperative complications will be avoided or minimized Outcome: Adequate for Discharge   Problem: Pain Management: Goal: Pain level will decrease with appropriate interventions Outcome: Adequate for Discharge   Problem: Skin Integrity: Goal: Will show signs of wound healing Outcome: Adequate for Discharge   Problem: Education: Goal: Knowledge of General Education information will improve Description: Including pain rating scale, medication(s)/side effects and non-pharmacologic comfort measures Outcome: Adequate for Discharge   Problem: Health Behavior/Discharge Planning: Goal: Ability to manage health-related needs will improve Outcome: Adequate for Discharge   Problem: Clinical Measurements: Goal: Ability to maintain clinical measurements within normal limits will improve Outcome: Adequate for Discharge Goal: Will remain free from infection Outcome: Adequate for Discharge Goal: Diagnostic test results will improve Outcome: Adequate for Discharge Goal: Respiratory complications will improve Outcome: Adequate for Discharge Goal:  Cardiovascular complication will be avoided Outcome: Adequate for Discharge   Problem: Activity: Goal: Risk for activity intolerance will decrease Outcome: Adequate for Discharge   Problem: Nutrition: Goal: Adequate nutrition will be maintained Outcome: Adequate for Discharge   Problem: Coping: Goal: Level of anxiety will decrease Outcome: Adequate for Discharge   Problem: Elimination: Goal: Will not experience complications related to bowel motility Outcome: Adequate for Discharge Goal: Will not experience complications related to urinary retention Outcome: Adequate for Discharge   Problem: Pain Managment: Goal: General experience of comfort will improve Outcome: Adequate for Discharge   Problem: Safety: Goal: Ability to remain free from injury will improve Outcome: Adequate for Discharge   Problem: Skin Integrity: Goal: Risk for impaired skin integrity will decrease Outcome: Adequate for Discharge

## 2022-06-02 NOTE — Telephone Encounter (Signed)
Received call from [facility] with [facility] advised the Rx for Oxycodone is being rejected by the [employer]. The insurance company advised the Oxycodone is to high of a dosage to give to him for his age.  The number to contact Karena Addison is [phone].

## 2022-06-02 NOTE — Progress Notes (Signed)
Physical Therapy Treatment Patient Details Name: Victor MCCONVILLE Sr. MRN: [id number] DOB: 1955-04-25 Today's Date: 06/02/2022   History of Present Illness 67 yo male presents to therapy s/p R THA on 05/30/2022 due to failure of conservative measures.  Pt has PMH including but not limited to HTN, insomnia, L THA (2015)    PT Comments    Pt ambulated in hallway and required increased time for mobility today reporting pain and stiffness.  Pt declined practicing stairs again.  Pt reports he performed exercises last night however unable to locate his HEP handout so provided another and reviewed exercises for home.  Pt reports understanding and feels ready for d/c home today.     Recommendations for follow up therapy are one component of a multi-disciplinary discharge planning process, led by the attending physician.  Recommendations may be updated based on patient status, additional functional criteria and insurance authorization.  Follow Up Recommendations  Follow physician's recommendations for discharge plan and follow up therapies     Assistance Recommended at Discharge Intermittent Supervision/Assistance  Patient can return home with the following Assistance with cooking/housework;Assist for transportation;Help with stairs or ramp for entrance   Equipment Recommendations  Rolling walker (2 wheels)    Recommendations for Other Services       Precautions / Restrictions Precautions Precautions: Fall Restrictions Weight Bearing Restrictions: No Other Position/Activity Restrictions: WBAT     Mobility  Bed Mobility Overal bed mobility: Needs Assistance Bed Mobility: Supine to Sit     Supine to sit: Supervision     General bed mobility comments: increased time and HOB elevated    Transfers Overall transfer level: Needs assistance Equipment used: Rolling walker (2 wheels) Transfers: Sit to/from Stand Sit to Stand: Supervision           General transfer comment:  increased time and effort    Ambulation/Gait Ambulation/Gait assistance: Min guard, Supervision Gait Distance (Feet): 200 Feet Assistive device: Rolling walker (2 wheels) Gait Pattern/deviations: Step-to pattern, Antalgic, Trunk flexed, Decreased stance time - right Gait velocity: decreased     General Gait Details: verbal cues for sequence, RW positioning, step length, posture; increased time to perform   Stairs             Wheelchair Mobility    Modified Rankin (Stroke Patients Only)       Balance                                            Cognition Arousal/Alertness: Awake/alert Behavior During Therapy: WFL for tasks assessed/performed Overall Cognitive Status: Within Functional Limits for tasks assessed                                          Exercises      General Comments        Pertinent Vitals/Pain Pain Assessment Pain Assessment: 0-10 Pain Score: 6  Pain Location: R hip Pain Descriptors / Indicators: Discomfort, Guarding, Sore Pain Intervention(s): Monitored during session, Premedicated before session, Repositioned    Home Living                          Prior Function            PT Goals (current goals can  now be found in the care plan section) Progress towards PT goals: Progressing toward goals    Frequency    7X/week      PT Plan Current plan remains appropriate    Co-evaluation              AM-PAC PT "6 Clicks" Mobility   Outcome Measure  Help needed turning from your back to your side while in a flat bed without using bedrails?: A Little Help needed moving from lying on your back to sitting on the side of a flat bed without using bedrails?: A Little Help needed moving to and from a bed to a chair (including a wheelchair)?: A Little Help needed standing up from a chair using your arms (e.g., wheelchair or bedside chair)?: A Little Help needed to walk in hospital room?:  A Little Help needed climbing 3-5 steps with a railing? : A Little 6 Click Score: 18    End of Session Equipment Utilized During Treatment: Gait belt Activity Tolerance: Patient tolerated treatment well Patient left: with call bell/phone within reach;with chair alarm set;in chair   PT Visit Diagnosis: Other abnormalities of gait and mobility (R26.89)     Time: [id number] PT Time Calculation (min) (ACUTE ONLY): 34 min  Charges:  $Gait Training: 23-37 mins                    Jannette Spanner PT, DPT Physical Therapist Acute Rehabilitation Services Preferred contact method: Secure Chat Weekend Pager Only: [phone] Office: [phone]    [name] 06/02/2022, 11:31 AM

## 2022-06-02 NOTE — Progress Notes (Signed)
Patient ID: Victor Meuse Sr., male   DOB: 30-Dec-1955, 67 y.o.   MRN: [id number] The patient doing well this morning.  His right operative hip is stable.  His vital signs are stable.  His dressing is clean and dry.  He can be discharged to home today.

## 2022-06-02 NOTE — Discharge Summary (Signed)
Patient ID: Victor Santiago Sr. MRN: [id number] DOB/AGE: 1955-07-30 67 y.o.  Admit date: 05/30/2022 Discharge date: 06/02/2022  Admission Diagnoses:  Principal Problem:   Unilateral primary osteoarthritis, right hip Active Problems:   Status post total replacement of right hip   S/P total right hip arthroplasty   Discharge Diagnoses:  Same  Past Medical History:  Diagnosis Date   Allergy    seasonal/ hay fever   Arthritis    hip/pt uses cane   Hypertension    Irregular heart beat    Numbness and tingling in left hand     Surgeries: Procedure(s): RIGHT TOTAL HIP ARTHROPLASTY ANTERIOR APPROACH on 05/30/2022   Consultants:   Discharged Condition: Improved  Hospital Course: Victor Santiago. is an 67 y.o. male who was admitted 05/30/2022 for operative treatment ofUnilateral primary osteoarthritis, right hip. Patient has severe unremitting pain that affects sleep, daily activities, and work/hobbies. After pre-op clearance the patient was taken to the operating room on 05/30/2022 and underwent  Procedure(s): RIGHT TOTAL HIP ARTHROPLASTY ANTERIOR APPROACH.    Patient was given perioperative antibiotics:  Anti-infectives (From admission, onward)    Start     Dose/Rate Route Frequency Ordered Stop   05/30/22 1700  ceFAZolin (ANCEF) IVPB 1 g/50 mL premix        1 g 100 mL/hr over 30 Minutes Intravenous Every 6 hours 05/30/22 1421 05/30/22 2244   05/30/22 0830  ceFAZolin (ANCEF) IVPB 2g/100 mL premix        2 g 200 mL/hr over 30 Minutes Intravenous On call to O.R. 05/30/22 0827 05/30/22 1118        Patient was given sequential compression devices, early ambulation, and chemoprophylaxis to prevent DVT.  Patient benefited maximally from hospital stay and there were no complications.    Recent vital signs: Patient Vitals for the past 24 hrs:  BP Temp Temp src Pulse Resp SpO2  06/02/22 0547 123/84 98.1 F (36.7 C) Oral 67 18 97 %  06/01/22 2137 138/75 99 F (37.2 C) Oral 72  18 100 %  06/01/22 1419 (!) 130/91 98.4 F (36.9 C) Oral 90 16 100 %  06/01/22 0947 136/83 -- -- -- -- --     Recent laboratory studies:  Recent Labs    05/31/22 0345  WBC 13.6*  HGB 11.3*  HCT 33.1*  PLT 227  NA 135  K 4.1  CL 101  CO2 26  BUN 13  CREATININE 0.91  GLUCOSE 125*  CALCIUM 8.6*     Discharge Medications:   Allergies as of 06/02/2022   No Known Allergies      Medication List     TAKE these medications    amLODipine 5 MG tablet Commonly known as: NORVASC Take 5 mg by mouth daily.   aspirin 81 MG chewable tablet Chew 1 tablet (81 mg total) by mouth 2 (two) times daily.   cloNIDine 0.1 MG tablet Commonly known as: CATAPRES Take 0.1 mg by mouth daily.   lisinopril 40 MG tablet Commonly known as: ZESTRIL Take 40 mg by mouth daily.   methocarbamol 500 MG tablet Commonly known as: ROBAXIN Take 1 tablet (500 mg total) by mouth every 6 (six) hours as needed for muscle spasms.   naloxone 4 MG/0.1ML Liqd nasal spray kit Commonly known as: NARCAN Place 1 spray into the nose as needed (overdose).   oxyCODONE 5 MG immediate release tablet Commonly known as: Oxy IR/ROXICODONE Take 1-2 tablets (5-10 mg total) by mouth every 4 (four)  hours as needed for moderate pain (pain score 4-6). What changed:  medication strength how much to take when to take this reasons to take this               Durable Medical Equipment  (From admission, onward)           Start     Ordered   05/30/22 1422  DME 3 n 1  Once        05/30/22 1421   05/30/22 1422  DME Walker rolling  Once       Question Answer Comment  Walker: With 5 Inch Wheels   Patient needs a walker to treat with the following condition Status post total replacement of right hip      05/30/22 1421            Diagnostic Studies: DG HIP UNILAT WITH PELVIS 1V RIGHT  Result Date: 05/30/2022 CLINICAL DATA:  Elective surgery. EXAM: DG HIP (WITH OR WITHOUT PELVIS) 1V RIGHT COMPARISON:   Preoperative radiograph. FINDINGS: Three fluoroscopic spot views of the pelvis and right hip obtained in the operating room. Right hip arthroplasty in place. Fluoroscopy time 13 seconds. Dose 1.6 mGy. IMPRESSION: Intraoperative fluoroscopy for right hip arthroplasty. Electronically Signed   By: Keith Rake M.D.   On: 05/30/2022 12:59   DG Pelvis Portable  Result Date: 05/30/2022 CLINICAL DATA:  Status post right hip replacement. EXAM: PORTABLE PELVIS 1-2 VIEWS COMPARISON:  Pelvis radiograph 03/07/2022 FINDINGS: Right hip arthroplasty in expected alignment. No periprosthetic lucency or fracture. Again seen right supra-acetabular sclerosis. Recent postsurgical change includes air and edema in the soft tissues. Lateral skin staples in place. Previous left hip arthroplasty. IMPRESSION: Right hip arthroplasty without immediate postoperative complication. Electronically Signed   By: Keith Rake M.D.   On: 05/30/2022 12:58   DG C-Arm 1-60 Min-No Report  Result Date: 05/30/2022 Fluoroscopy was utilized by the requesting physician.  No radiographic interpretation.    Disposition: Discharge disposition: 01-Home or [place] [place] Follow up.   Specialty: Home Health Services Why: [facility] will provide PT in the home after discharge. Contact information: [address] [place] [address] [phone]                  Signed: Mcarthur Rossetti 06/02/2022, 7:27 AM

## 2022-06-10 ENCOUNTER — Other Ambulatory Visit: Payer: Self-pay | Admitting: Orthopaedic Surgery

## 2022-06-10 ENCOUNTER — Telehealth: Payer: Self-pay | Admitting: Orthopaedic Surgery

## 2022-06-10 MED ORDER — OXYCODONE HCL 5 MG PO TABS: 5.0000 mg | ORAL_TABLET | Freq: Four times a day (QID) | ORAL | 0 refills | Status: DC | PRN

## 2022-06-10 NOTE — Telephone Encounter (Signed)
Patient would like a refill on oxycodone  '10mg'$ 

## 2022-06-12 ENCOUNTER — Ambulatory Visit (INDEPENDENT_AMBULATORY_CARE_PROVIDER_SITE_OTHER): Payer: Medicare HMO | Admitting: Orthopaedic Surgery

## 2022-06-12 ENCOUNTER — Encounter: Payer: Self-pay | Admitting: Orthopaedic Surgery

## 2022-06-12 DIAGNOSIS — Z96641 Presence of right artificial hip joint: Secondary | ICD-10-CM

## 2022-06-12 MED ORDER — OXYCODONE HCL 5 MG PO TABS: 5.0000 mg | ORAL_TABLET | Freq: Four times a day (QID) | ORAL | 0 refills | Status: DC | PRN

## 2022-06-12 NOTE — Progress Notes (Signed)
The patient is here for his first postoperative visit status post a right total hip arthroplasty.  We replaced his left hip several years ago.  He says he is working with home health therapy and reports that he is having good range of motion and strength.  He has been compliant with a baby aspirin twice a day.  He is requesting refill on his oxycodone.  His leg lengths are equal.  He tolerates me easily putting his right operative hip through range of motion.  His incision looks good.  Staples been removed and Steri-Strips applied.  I will send in oxycodone one more time and then we need to wean him to hydrocodone.  I would like to see him back in 4 weeks to see how he is doing overall.  He can stop his aspirin as well.  No x-rays are needed at the next visit.

## 2022-06-25 ENCOUNTER — Telehealth: Payer: Self-pay | Admitting: Orthopaedic Surgery

## 2022-06-25 ENCOUNTER — Other Ambulatory Visit: Payer: Self-pay

## 2022-06-25 ENCOUNTER — Other Ambulatory Visit: Payer: Self-pay | Admitting: Orthopaedic Surgery

## 2022-06-25 DIAGNOSIS — Z96641 Presence of right artificial hip joint: Secondary | ICD-10-CM

## 2022-06-25 MED ORDER — OXYCODONE HCL 5 MG PO TABS: 5.0000 mg | ORAL_TABLET | Freq: Three times a day (TID) | ORAL | 0 refills | Status: AC | PRN

## 2022-06-25 MED ORDER — METHOCARBAMOL 500 MG PO TABS: 500.0000 mg | ORAL_TABLET | Freq: Four times a day (QID) | ORAL | 1 refills | Status: DC | PRN

## 2022-06-25 NOTE — Telephone Encounter (Signed)
Patient asking for his Oxycodone refilled and muscle relaxer also( did not know name of muscle relaxer)

## 2022-06-25 NOTE — Telephone Encounter (Signed)
Patient called. He would like a referral for outpatient PT. His call back number is [phone]

## 2022-06-25 NOTE — Telephone Encounter (Signed)
Patient called. He would like a refill on oxycodone and muscle relaxer. His call back number is [phone]

## 2022-06-25 NOTE — Telephone Encounter (Signed)
Tried calling pt to advise but phone kept ringing, no answer and and way to leave a voicemail

## 2022-06-25 NOTE — Telephone Encounter (Signed)
Order sent for PT 

## 2022-06-25 NOTE — Telephone Encounter (Signed)
This was already sent to pharmacy.

## 2022-07-14 ENCOUNTER — Ambulatory Visit (INDEPENDENT_AMBULATORY_CARE_PROVIDER_SITE_OTHER): Payer: Medicare HMO | Admitting: Orthopaedic Surgery

## 2022-07-14 ENCOUNTER — Encounter: Payer: Self-pay | Admitting: Orthopaedic Surgery

## 2022-07-14 DIAGNOSIS — Z96641 Presence of right artificial hip joint: Secondary | ICD-10-CM

## 2022-07-14 NOTE — Progress Notes (Signed)
The patient is around 6 weeks status post a right total hip replacement to treat severe right hip arthritis.  We have replaced his left hip in the past as well.  He is an active and [place] appearing as well as thin 67 year old gentleman.  He is ambulating with a cane but looks great overall.  His right operative hip moves smoothly and fluidly as does his left hip.  He does not have any significant limp to his gait at all and gets up easily.  From my standpoint he will continue to increase his activities as comfort allows and he can get back into the gym.  We will see him in 3 months with a standing low AP pelvis

## 2022-07-16 ENCOUNTER — Other Ambulatory Visit: Payer: Self-pay | Admitting: Orthopaedic Surgery

## 2022-07-16 ENCOUNTER — Telehealth: Payer: Self-pay | Admitting: Orthopaedic Surgery

## 2022-07-16 MED ORDER — METHOCARBAMOL 500 MG PO TABS: 500.0000 mg | ORAL_TABLET | Freq: Four times a day (QID) | ORAL | 1 refills | Status: DC | PRN

## 2022-07-16 NOTE — Telephone Encounter (Signed)
Pt called requesting muscle relaxer to help with pain. Pt states he is having a hard time sleeping. Please send to [facility]. Pt phone number [phone].

## 2022-07-18 ENCOUNTER — Telehealth: Payer: Self-pay | Admitting: Orthopaedic Surgery

## 2022-07-18 MED ORDER — METHOCARBAMOL 500 MG PO TABS: 500.0000 mg | ORAL_TABLET | Freq: Four times a day (QID) | ORAL | 1 refills | Status: DC | PRN

## 2022-07-18 NOTE — Telephone Encounter (Signed)
Patient asking for a refill on his muscle relaxer. Methocarbamol 500mg 

## 2022-07-18 NOTE — Telephone Encounter (Signed)
Sent to pharmacy 

## 2022-07-18 NOTE — Addendum Note (Signed)
Addended by: Barbette Or on: 07/18/2022 10:47 AM   Modules accepted: Orders

## 2022-08-14 ENCOUNTER — Other Ambulatory Visit: Payer: Self-pay | Admitting: Orthopaedic Surgery

## 2022-10-13 ENCOUNTER — Encounter: Payer: Medicare HMO | Admitting: Orthopaedic Surgery

## 2023-01-09 ENCOUNTER — Other Ambulatory Visit: Payer: Self-pay | Admitting: Orthopaedic Surgery
